# Patient Record
Sex: Male | Born: 1970 | Race: White | Hispanic: No | Smoking: Current every day smoker
Health system: Southern US, Community
[De-identification: ages and names within clinical notes are randomized; demographics above are authoritative.]

---

## 2010-04-08 ENCOUNTER — Inpatient Hospital Stay (HOSPITAL_COMMUNITY): Admission: EM | Admit: 2010-04-08 | Discharge: 2010-04-16 | Payer: Self-pay | Admitting: Pulmonary Disease

## 2010-04-08 ENCOUNTER — Ambulatory Visit: Payer: Self-pay | Admitting: Internal Medicine

## 2010-04-08 ENCOUNTER — Encounter: Payer: Self-pay | Admitting: Pulmonary Disease

## 2010-04-08 ENCOUNTER — Ambulatory Visit: Payer: Self-pay | Admitting: Pulmonary Disease

## 2011-01-19 ENCOUNTER — Encounter: Payer: Self-pay | Admitting: Pulmonary Disease

## 2011-03-18 LAB — COMPREHENSIVE METABOLIC PANEL
AST: 105 U/L — ABNORMAL HIGH (ref 0–37)
AST: 162 U/L — ABNORMAL HIGH (ref 0–37)
Albumin: 2.7 g/dL — ABNORMAL LOW (ref 3.5–5.2)
Albumin: 2.8 g/dL — ABNORMAL LOW (ref 3.5–5.2)
BUN: 35 mg/dL — ABNORMAL HIGH (ref 6–23)
Calcium: 8.2 mg/dL — ABNORMAL LOW (ref 8.4–10.5)
Calcium: 8.3 mg/dL — ABNORMAL LOW (ref 8.4–10.5)
Chloride: 105 mEq/L (ref 96–112)
GFR calc Af Amer: 46 mL/min — ABNORMAL LOW (ref >60)
GFR calc Af Amer: 48 mL/min — ABNORMAL LOW (ref >60)
GFR calc non Af Amer: 38 mL/min — ABNORMAL LOW (ref >60)
GFR calc non Af Amer: 39 mL/min — ABNORMAL LOW (ref >60)
Potassium: 3.6 mEq/L (ref 3.5–5.1)
Sodium: 137 mEq/L (ref 135–145)
Total Bilirubin: 1 mg/dL (ref 0.3–1.2)
Total Protein: 5.1 g/dL — ABNORMAL LOW (ref 6.0–8.3)
Total Protein: 5.4 g/dL — ABNORMAL LOW (ref 6.0–8.3)

## 2011-03-18 LAB — BASIC METABOLIC PANEL
BUN: 19 mg/dL (ref 6–23)
CO2: 26 mEq/L (ref 19–32)
Calcium: 8.7 mg/dL (ref 8.4–10.5)
Chloride: 104 mEq/L (ref 96–112)
Creatinine, Ser: 1.89 mg/dL — ABNORMAL HIGH (ref 0.4–1.5)
GFR calc Af Amer: 48 mL/min — ABNORMAL LOW (ref >60)
GFR calc non Af Amer: 42 mL/min — ABNORMAL LOW (ref >60)
Glucose, Bld: 91 mg/dL (ref 70–99)
Potassium: 3.6 mEq/L (ref 3.5–5.1)
Potassium: 3.9 mEq/L (ref 3.5–5.1)
Sodium: 136 mEq/L (ref 135–145)
Sodium: 142 mEq/L (ref 135–145)

## 2011-03-18 LAB — GLUCOSE, CAPILLARY
Glucose-Capillary: 104 mg/dL — ABNORMAL HIGH (ref 70–99)
Glucose-Capillary: 114 mg/dL — ABNORMAL HIGH (ref 70–99)
Glucose-Capillary: 118 mg/dL — ABNORMAL HIGH (ref 70–99)
Glucose-Capillary: 136 mg/dL — ABNORMAL HIGH (ref 70–99)
Glucose-Capillary: 139 mg/dL — ABNORMAL HIGH (ref 70–99)
Glucose-Capillary: 80 mg/dL (ref 70–99)
Glucose-Capillary: 86 mg/dL (ref 70–99)
Glucose-Capillary: 87 mg/dL (ref 70–99)
Glucose-Capillary: 93 mg/dL (ref 70–99)
Glucose-Capillary: 94 mg/dL (ref 70–99)
Glucose-Capillary: 96 mg/dL (ref 70–99)

## 2011-03-18 LAB — CBC
HCT: 34.5 % — ABNORMAL LOW (ref 39.0–52.0)
HCT: 35.9 % — ABNORMAL LOW (ref 39.0–52.0)
HCT: 37.5 % — ABNORMAL LOW (ref 39.0–52.0)
Hemoglobin: 12.9 g/dL — ABNORMAL LOW (ref 13.0–17.0)
MCHC: 35.4 g/dL (ref 30.0–36.0)
MCV: 91.5 fL (ref 78.0–100.0)
MCV: 91.8 fL (ref 78.0–100.0)
Platelets: 153 10*3/uL (ref 150–400)
RDW: 12.9 % (ref 11.5–15.5)
WBC: 8.1 10*3/uL (ref 4.0–10.5)
WBC: 8.4 10*3/uL (ref 4.0–10.5)

## 2011-03-18 LAB — CK: Total CK: 1734 U/L — ABNORMAL HIGH (ref 7–232)

## 2011-03-19 LAB — BASIC METABOLIC PANEL
BUN: 37 mg/dL — ABNORMAL HIGH (ref 6–23)
Chloride: 103 mEq/L (ref 96–112)
Chloride: 104 mEq/L (ref 96–112)
Creatinine, Ser: 2.36 mg/dL — ABNORMAL HIGH (ref 0.4–1.5)
GFR calc Af Amer: 26 mL/min — ABNORMAL LOW (ref >60)
Glucose, Bld: 84 mg/dL (ref 70–99)
Potassium: 3.8 mEq/L (ref 3.5–5.1)
Potassium: 4.3 mEq/L (ref 3.5–5.1)
Sodium: 138 mEq/L (ref 135–145)

## 2011-03-19 LAB — COMPREHENSIVE METABOLIC PANEL
ALT: 1074 U/L — ABNORMAL HIGH (ref 0–53)
AST: 3221 U/L — ABNORMAL HIGH (ref 0–37)
AST: 644 U/L — ABNORMAL HIGH (ref 0–37)
Albumin: 2.9 g/dL — ABNORMAL LOW (ref 3.5–5.2)
Alkaline Phosphatase: 50 U/L (ref 39–117)
Alkaline Phosphatase: 50 U/L (ref 39–117)
BUN: 36 mg/dL — ABNORMAL HIGH (ref 6–23)
CO2: 23 mEq/L (ref 19–32)
CO2: 27 mEq/L (ref 19–32)
Calcium: 7.3 mg/dL — ABNORMAL LOW (ref 8.4–10.5)
Calcium: 7.4 mg/dL — ABNORMAL LOW (ref 8.4–10.5)
Chloride: 104 mEq/L (ref 96–112)
Creatinine, Ser: 3.14 mg/dL — ABNORMAL HIGH (ref 0.4–1.5)
Creatinine, Ser: 3.26 mg/dL — ABNORMAL HIGH (ref 0.4–1.5)
GFR calc Af Amer: 26 mL/min — ABNORMAL LOW (ref >60)
GFR calc Af Amer: 27 mL/min — ABNORMAL LOW (ref >60)
GFR calc non Af Amer: 21 mL/min — ABNORMAL LOW (ref >60)
GFR calc non Af Amer: 22 mL/min — ABNORMAL LOW (ref >60)
Glucose, Bld: 121 mg/dL — ABNORMAL HIGH (ref 70–99)
Potassium: 4.8 mEq/L (ref 3.5–5.1)
Sodium: 136 mEq/L (ref 135–145)
Total Bilirubin: 0.8 mg/dL (ref 0.3–1.2)
Total Protein: 5.1 g/dL — ABNORMAL LOW (ref 6.0–8.3)
Total Protein: 5.2 g/dL — ABNORMAL LOW (ref 6.0–8.3)

## 2011-03-19 LAB — CK TOTAL AND CKMB (NOT AT ARMC)
CK, MB: 113.1 ng/mL (ref 0.3–4.0)
CK, MB: 18.8 ng/mL (ref 0.3–4.0)
Relative Index: 0.2 (ref 0.0–2.5)
Relative Index: 0.4 (ref 0.0–2.5)
Total CK: 11209 U/L — ABNORMAL HIGH (ref 7–232)
Total CK: 25491 U/L — ABNORMAL HIGH (ref 7–232)
Total CK: 42744 U/L — ABNORMAL HIGH (ref 7–232)

## 2011-03-19 LAB — STREP PNEUMONIAE URINARY ANTIGEN: Strep Pneumo Urinary Antigen: NEGATIVE

## 2011-03-19 LAB — CBC
HCT: 35.1 % — ABNORMAL LOW (ref 39.0–52.0)
HCT: 42.2 % (ref 39.0–52.0)
Hemoglobin: 13.7 g/dL (ref 13.0–17.0)
MCV: 91.1 fL (ref 78.0–100.0)
MCV: 91.9 fL (ref 78.0–100.0)
MCV: 92.2 fL (ref 78.0–100.0)
Platelets: 122 10*3/uL — ABNORMAL LOW (ref 150–400)
Platelets: DECREASED 10*3/uL (ref 150–400)
RBC: 3.94 MIL/uL — ABNORMAL LOW (ref 4.22–5.81)
RBC: 4.28 MIL/uL (ref 4.22–5.81)
RBC: 4.57 MIL/uL (ref 4.22–5.81)
RDW: 12.8 % (ref 11.5–15.5)
RDW: 13.3 % (ref 11.5–15.5)
WBC: 13 10*3/uL — ABNORMAL HIGH (ref 4.0–10.5)
WBC: 13.8 10*3/uL — ABNORMAL HIGH (ref 4.0–10.5)

## 2011-03-19 LAB — POCT I-STAT 3, ART BLOOD GAS (G3+)
Patient temperature: 97.8
Patient temperature: 98
TCO2: 25 mmol/L (ref 0–100)
TCO2: 26 mmol/L (ref 0–100)
pCO2 arterial: 41.2 mmHg (ref 35.0–45.0)
pCO2 arterial: 48.9 mmHg — ABNORMAL HIGH (ref 35.0–45.0)
pCO2 arterial: 52.6 mmHg — ABNORMAL HIGH (ref 35.0–45.0)
pH, Arterial: 7.302 — ABNORMAL LOW (ref 7.350–7.450)
pH, Arterial: 7.303 — ABNORMAL LOW (ref 7.350–7.450)
pH, Arterial: 7.373 (ref 7.350–7.450)
pO2, Arterial: 83 mmHg (ref 80.0–100.0)

## 2011-03-19 LAB — URINALYSIS, ROUTINE W REFLEX MICROSCOPIC
Glucose, UA: NEGATIVE mg/dL
Protein, ur: 100 mg/dL — AB
Specific Gravity, Urine: 1.016 (ref 1.005–1.030)
Urobilinogen, UA: 1 mg/dL (ref 0.0–1.0)

## 2011-03-19 LAB — GLUCOSE, CAPILLARY
Glucose-Capillary: 100 mg/dL — ABNORMAL HIGH (ref 70–99)
Glucose-Capillary: 100 mg/dL — ABNORMAL HIGH (ref 70–99)
Glucose-Capillary: 102 mg/dL — ABNORMAL HIGH (ref 70–99)
Glucose-Capillary: 106 mg/dL — ABNORMAL HIGH (ref 70–99)
Glucose-Capillary: 121 mg/dL — ABNORMAL HIGH (ref 70–99)
Glucose-Capillary: 124 mg/dL — ABNORMAL HIGH (ref 70–99)
Glucose-Capillary: 129 mg/dL — ABNORMAL HIGH (ref 70–99)
Glucose-Capillary: 78 mg/dL (ref 70–99)
Glucose-Capillary: 86 mg/dL (ref 70–99)
Glucose-Capillary: 93 mg/dL (ref 70–99)
Glucose-Capillary: 96 mg/dL (ref 70–99)
Glucose-Capillary: 99 mg/dL (ref 70–99)

## 2011-03-19 LAB — DIFFERENTIAL
Basophils Absolute: 0.2 10*3/uL — ABNORMAL HIGH (ref 0.0–0.1)
Basophils Relative: 1 % (ref 0–1)
Eosinophils Absolute: 0 10*3/uL (ref 0.0–0.7)
Eosinophils Relative: 0 % (ref 0–5)
Lymphocytes Relative: 14 % (ref 12–46)

## 2011-03-19 LAB — CK: Total CK: 5443 U/L — ABNORMAL HIGH (ref 7–232)

## 2011-03-19 LAB — PROTIME-INR
INR: 1.22 (ref 0.00–1.49)
Prothrombin Time: 15.3 seconds — ABNORMAL HIGH (ref 11.6–15.2)
Prothrombin Time: 17.8 seconds — ABNORMAL HIGH (ref 11.6–15.2)

## 2011-03-19 LAB — TROPONIN I: Troponin I: 5.43 ng/mL (ref 0.00–0.06)

## 2011-03-19 LAB — HEPATIC FUNCTION PANEL
Bilirubin, Direct: 0.2 mg/dL (ref 0.0–0.3)
Indirect Bilirubin: 0.9 mg/dL (ref 0.3–0.9)
Total Bilirubin: 1.1 mg/dL (ref 0.3–1.2)

## 2011-03-19 LAB — CARDIAC PANEL(CRET KIN+CKTOT+MB+TROPI)
CK, MB: 154.6 ng/mL (ref 0.3–4.0)
Relative Index: 0.5 (ref 0.0–2.5)
Troponin I: 5.86 ng/mL (ref 0.00–0.06)

## 2011-03-19 LAB — CREATININE, URINE, RANDOM: Creatinine, Urine: 173.2 mg/dL

## 2011-03-19 LAB — LACTIC ACID, PLASMA: Lactic Acid, Venous: 1.6 mmol/L (ref 0.5–2.2)

## 2011-03-19 LAB — URINE MICROSCOPIC-ADD ON

## 2011-03-19 LAB — LEGIONELLA ANTIGEN, URINE

## 2011-03-19 LAB — MRSA PCR SCREENING

## 2011-03-19 LAB — VANCOMYCIN, TROUGH: Vancomycin Tr: <5 ug/mL — ABNORMAL LOW (ref 10.0–20.0)

## 2011-03-19 LAB — URINE CULTURE
Colony Count: NO GROWTH
Culture: NO GROWTH

## 2011-03-19 LAB — APTT: aPTT: 29 seconds (ref 24–37)

## 2011-03-19 LAB — CULTURE, BLOOD (ROUTINE X 2): Culture: NO GROWTH

## 2011-03-19 LAB — TYPE AND SCREEN: ABO/RH(D): O POS

## 2011-03-19 LAB — PHOSPHORUS
Phosphorus: 5.6 mg/dL — ABNORMAL HIGH (ref 2.3–4.6)
Phosphorus: 6 mg/dL — ABNORMAL HIGH (ref 2.3–4.6)

## 2011-03-19 LAB — MAGNESIUM: Magnesium: 2.5 mg/dL (ref 1.5–2.5)

## 2011-03-19 LAB — BRAIN NATRIURETIC PEPTIDE: Pro B Natriuretic peptide (BNP): 256 pg/mL — ABNORMAL HIGH (ref 0.0–100.0)

## 2011-03-19 LAB — D-DIMER, QUANTITATIVE: D-Dimer, Quant: >20 ug/mL-FEU — ABNORMAL HIGH (ref 0.00–0.48)

## 2011-11-26 IMAGING — CR DG CHEST 1V PORT
1 series · 1 of 1 positions shown · non-contrast
Comparison: 04/08/2010

CLINICAL DATA: History of respiratory failure and ventilator
therapy.

PORTABLE CHEST - 1 VIEW

[view not recorded]
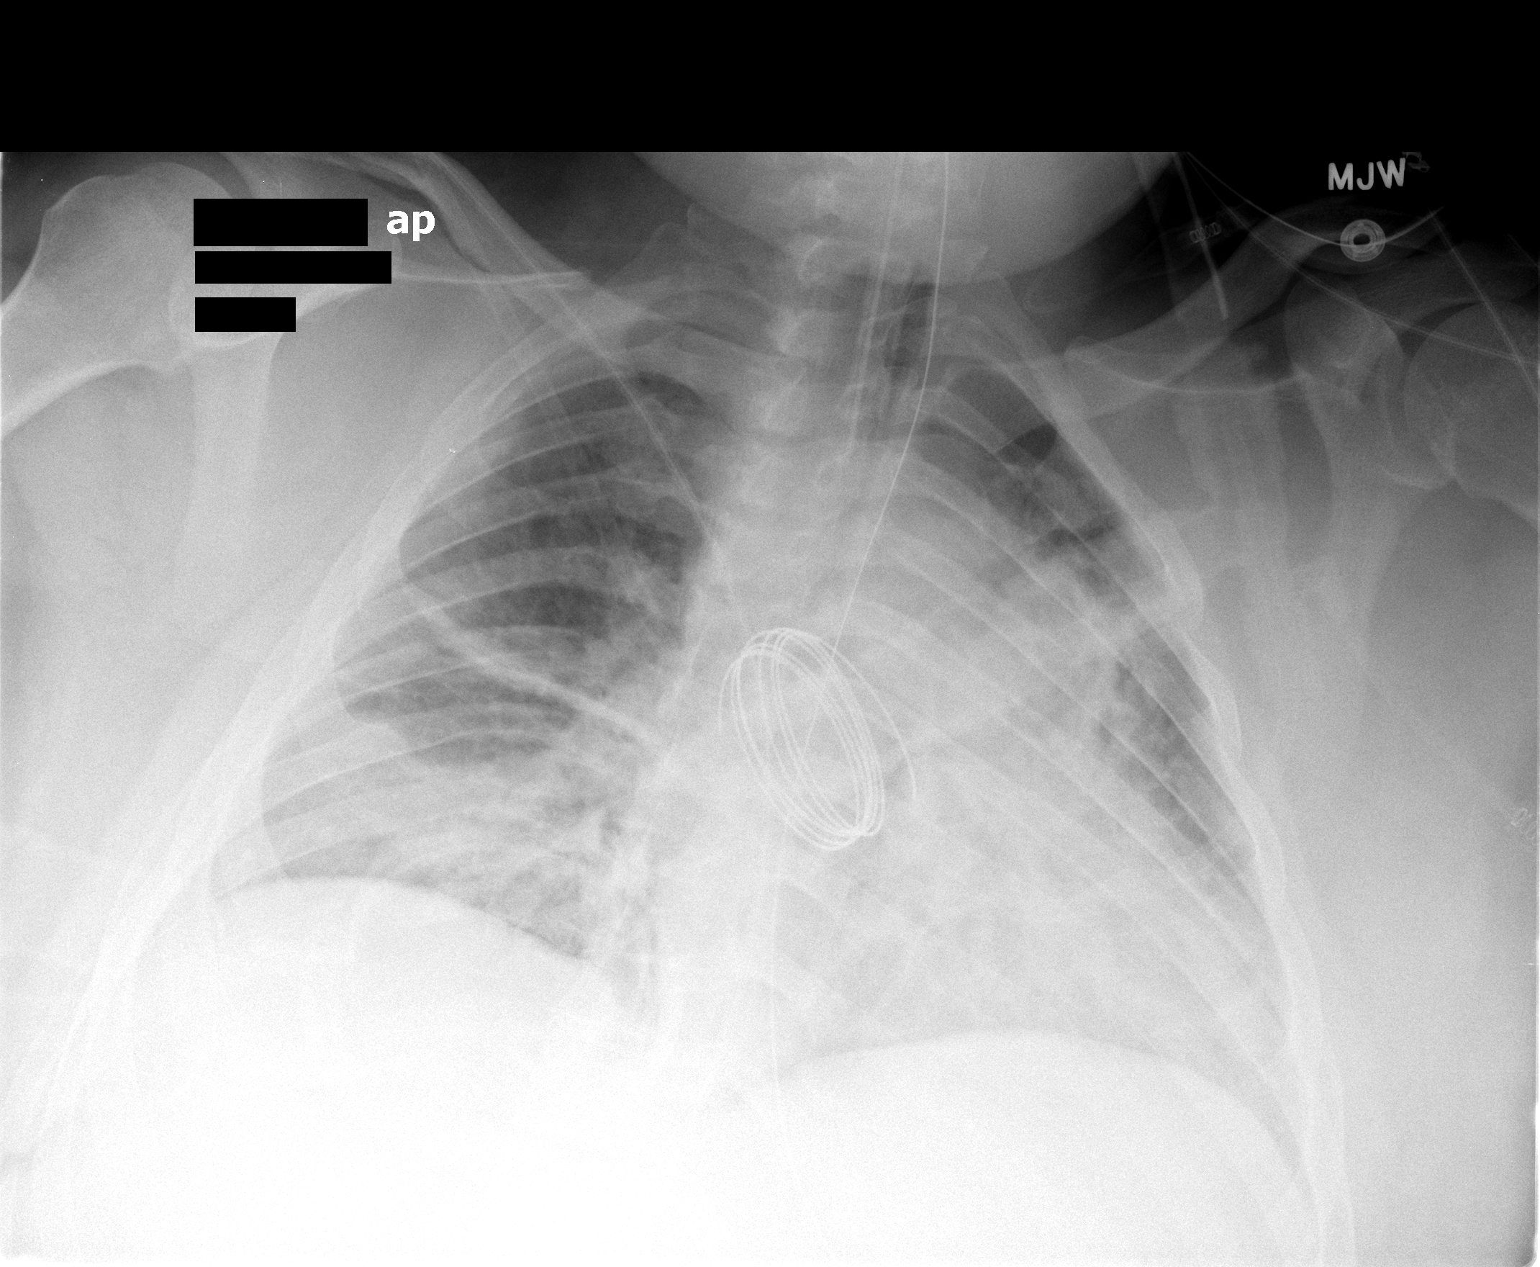

[1 of 1 positions shown; findings below may reference images not displayed]

FINDINGS: Endotracheal tube tip terminates 3 cm above carina.
Enteric tube distal portion enters stomach.  Tip is not included on
image.  There is stable enlargement of the cardiac silhouette.  The
subsegmental atelectasis or fluid in minor fissure appears slightly
smaller.  There is increasing atelectasis and infiltrative density
in the right lung base.  There is also patchy infiltrative density
and atelectasis seen in the left mid and lower lung zones.  There
are overall low lung volumes.  No pleural effusion is demonstrated.
IMPRESSION: Endotracheal tube and enteric tube are in place. Stable enlargement
of the cardiac silhouette is present.  Subsegmental atelectasis or
fluid in minor fissure appears smaller.  Increasing atelectasis and
infiltrative density is present in the right lung base.  Patchy
infiltrative density and atelectasis is seen on the left in the mid
and lower lung zones.

## 2011-11-29 IMAGING — CR DG CHEST 2V
2 series · 2 of 2 positions shown · non-contrast
Comparison: 04/10/2010

CLINICAL DATA: Follow-up right lower lobe infiltrate.
Rhabdomyolysis.  Previous myocardial infarct.

CHEST - 2 VIEW

[w chest pa]
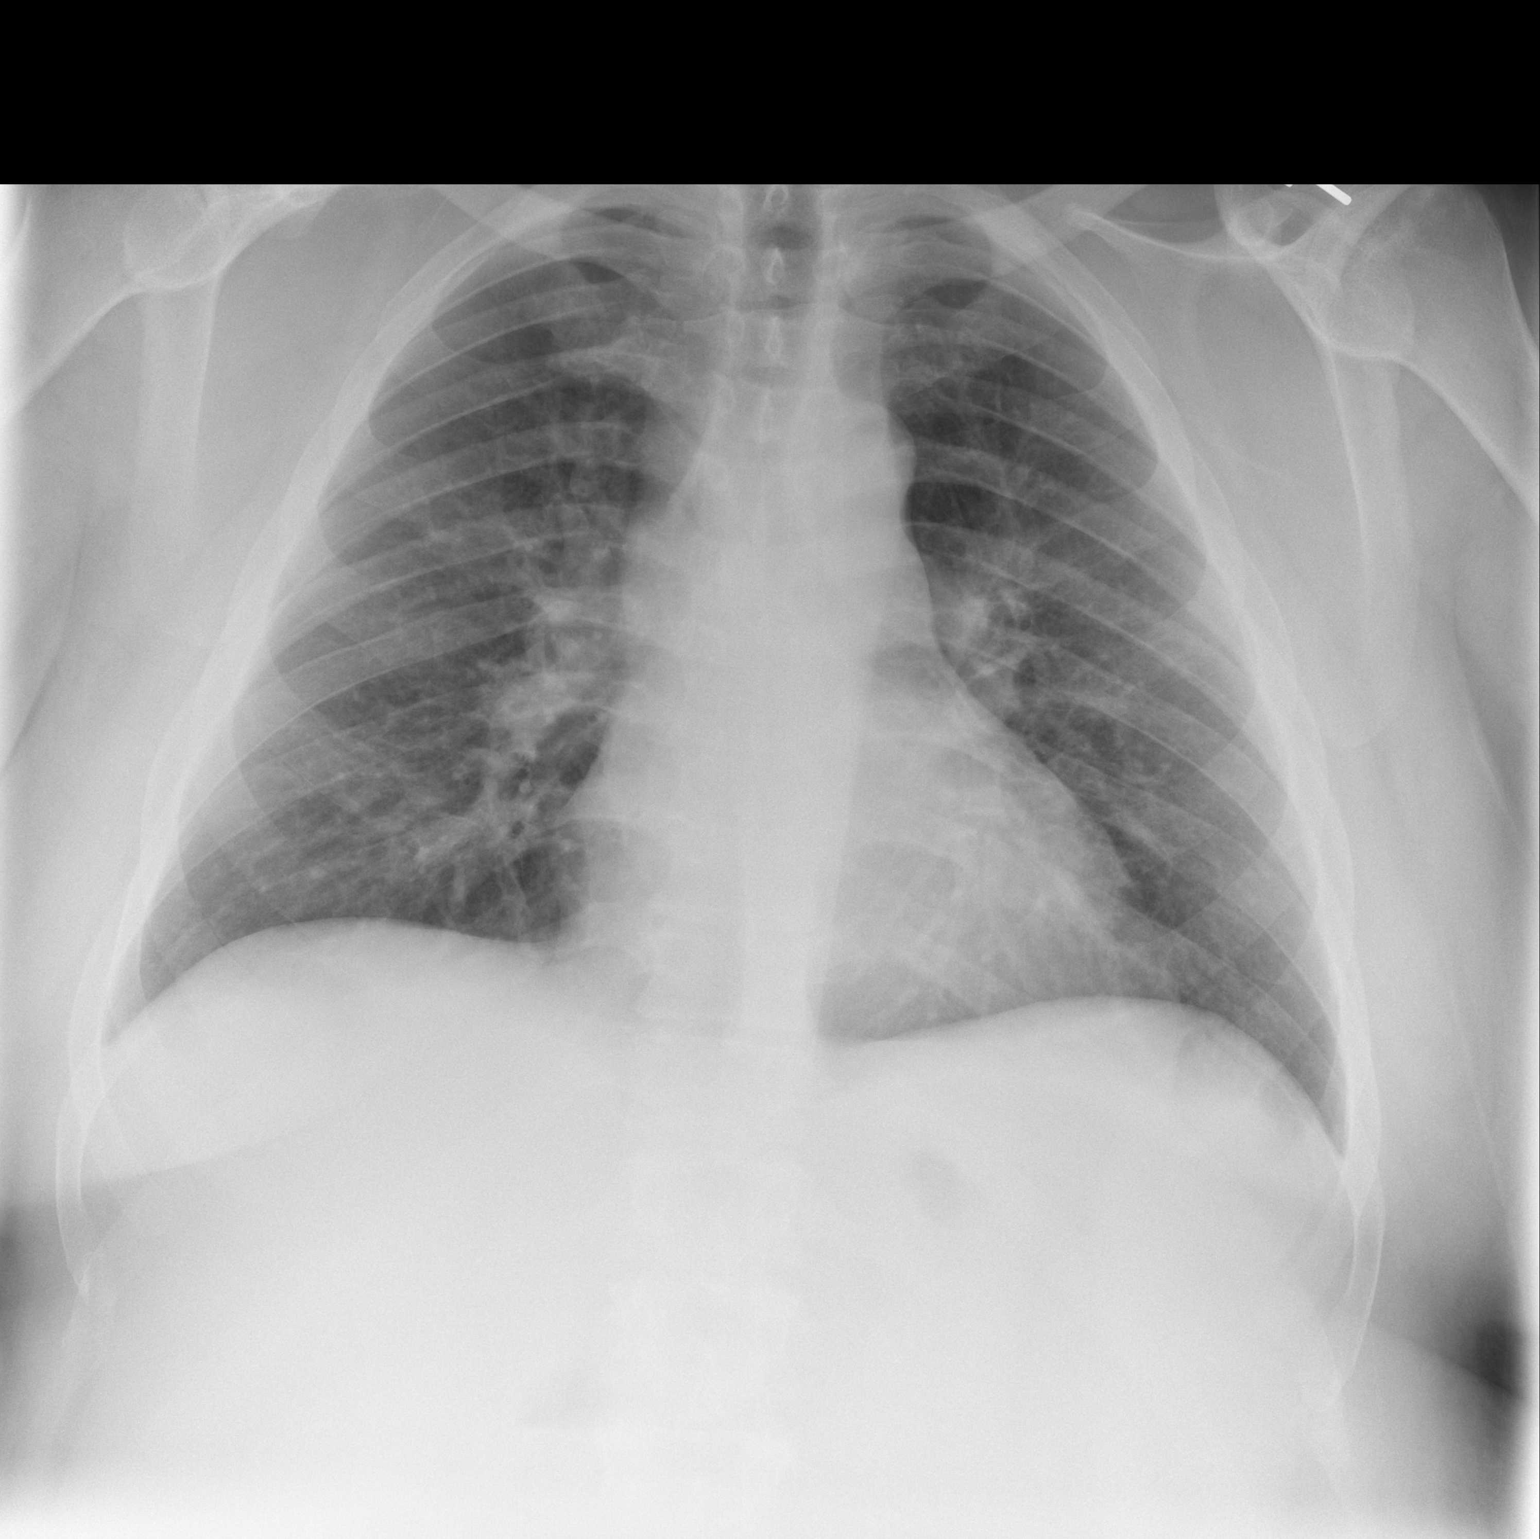

[w chest lat]
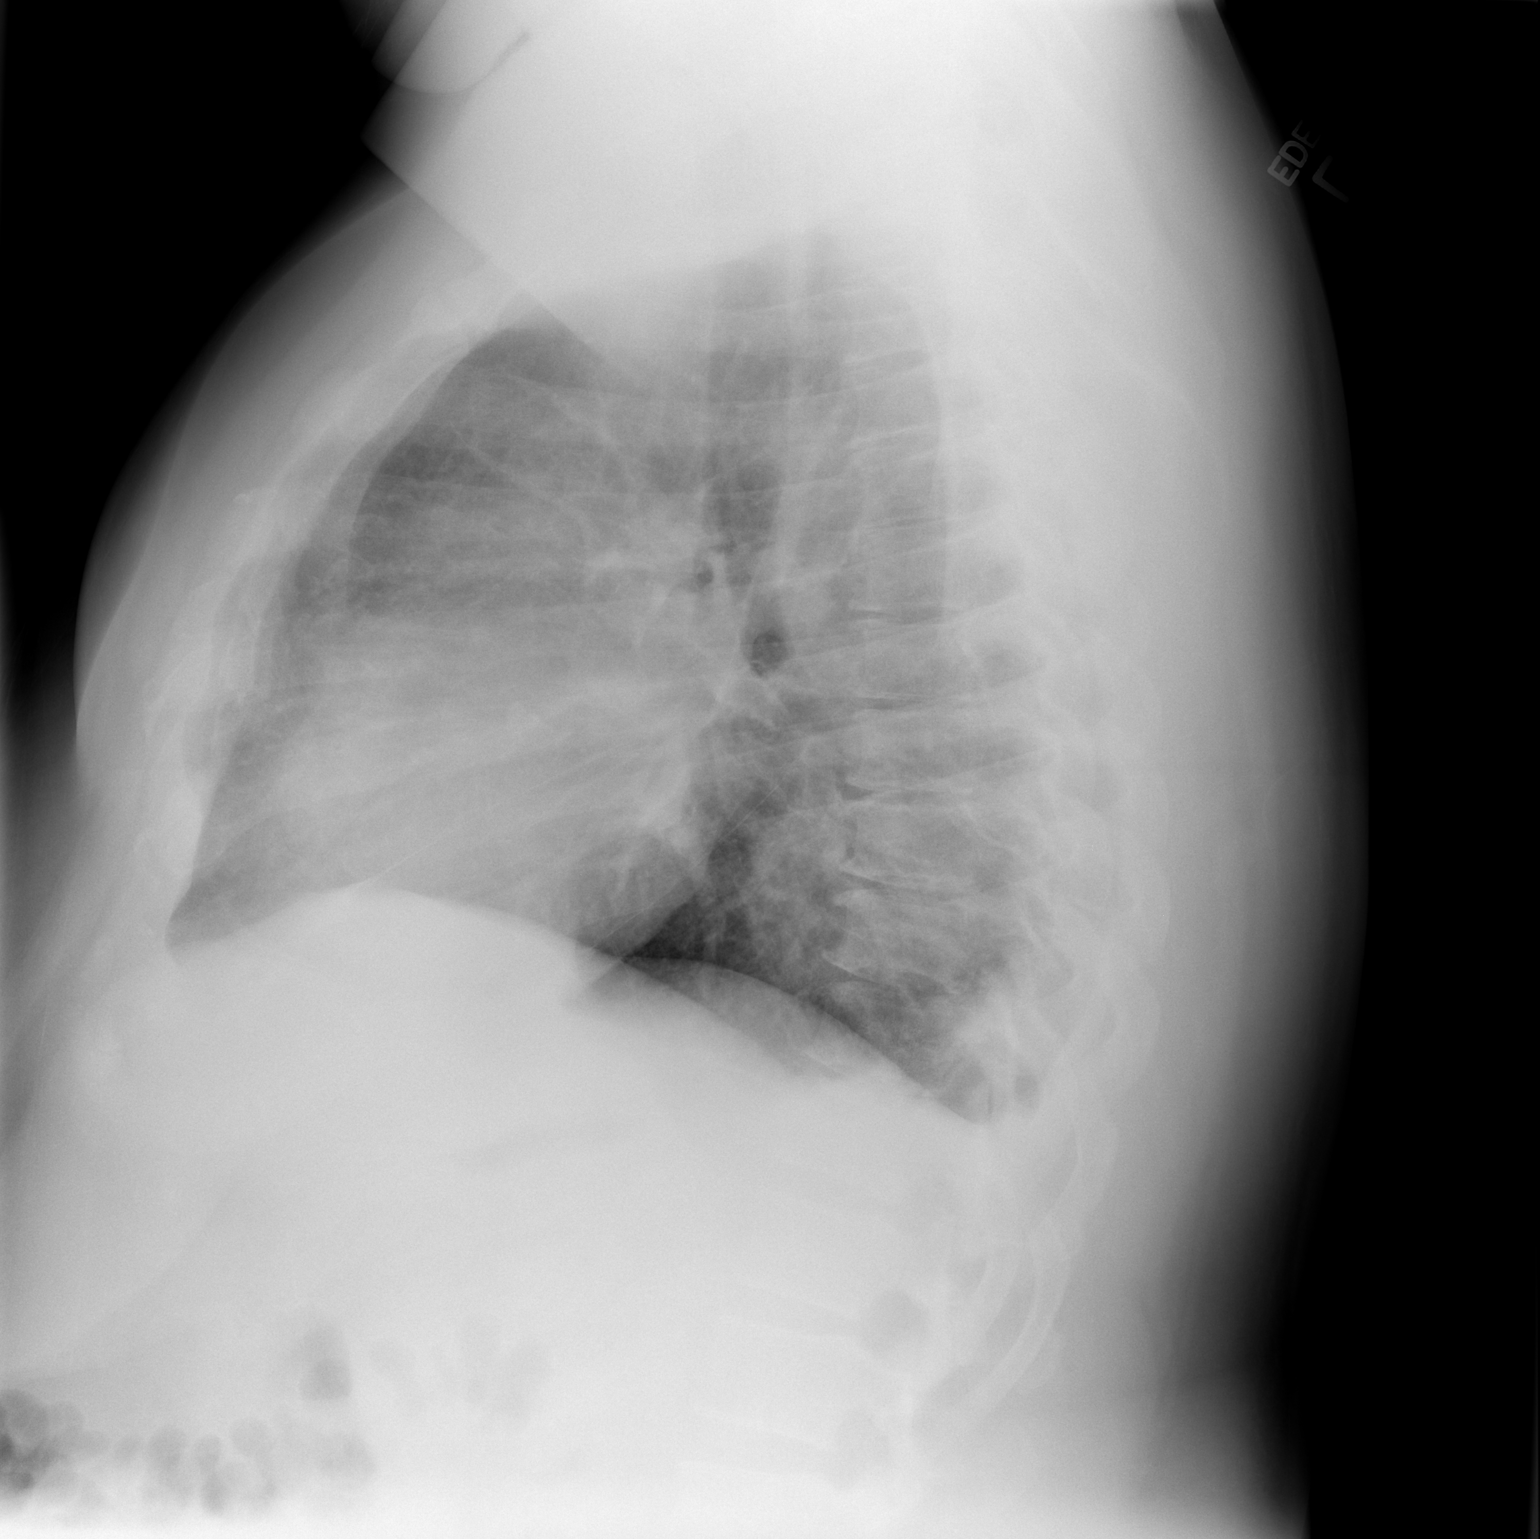

[2 of 2 positions shown; findings below may reference images not displayed]

FINDINGS: Previously seen airspace opacity in the right lung base
has resolved.  Chronic central peribronchial thickening and
interstitial prominence noted.  Lungs otherwise clear.  Tiny
posterior left pleural effusion versus pleural thickening noted.
Heart size and mediastinal contours are within normal limits
IMPRESSION: Resolution of right lower lung airspace opacity since prior study.
Question tiny left posterior pleural effusion versus pleural
thickening.

## 2017-09-02 ENCOUNTER — Encounter (HOSPITAL_COMMUNITY): Payer: Self-pay

## 2017-09-02 ENCOUNTER — Inpatient Hospital Stay (HOSPITAL_COMMUNITY)
Admission: AD | Admit: 2017-09-02 | Discharge: 2017-09-07 | DRG: 885 | Disposition: A | Discharge status: Home / Self Care | Payer: Federal, State, Local not specified - Other | Source: Intra-hospital | Attending: Psychiatry | Admitting: Psychiatry

## 2017-09-02 DIAGNOSIS — Z794 Long term (current) use of insulin: Secondary | ICD-10-CM

## 2017-09-02 DIAGNOSIS — I10 Essential (primary) hypertension: Secondary | ICD-10-CM | POA: Diagnosis present

## 2017-09-02 DIAGNOSIS — E119 Type 2 diabetes mellitus without complications: Secondary | ICD-10-CM | POA: Diagnosis present

## 2017-09-02 DIAGNOSIS — Z881 Allergy status to other antibiotic agents status: Secondary | ICD-10-CM

## 2017-09-02 DIAGNOSIS — F1124 Opioid dependence with opioid-induced mood disorder: Secondary | ICD-10-CM

## 2017-09-02 DIAGNOSIS — Z882 Allergy status to sulfonamides status: Secondary | ICD-10-CM | POA: Diagnosis not present

## 2017-09-02 DIAGNOSIS — F1123 Opioid dependence with withdrawal: Secondary | ICD-10-CM | POA: Diagnosis present

## 2017-09-02 DIAGNOSIS — G4710 Hypersomnia, unspecified: Secondary | ICD-10-CM | POA: Diagnosis present

## 2017-09-02 DIAGNOSIS — F333 Major depressive disorder, recurrent, severe with psychotic symptoms: Secondary | ICD-10-CM | POA: Diagnosis not present

## 2017-09-02 DIAGNOSIS — F332 Major depressive disorder, recurrent severe without psychotic features: Secondary | ICD-10-CM | POA: Diagnosis present

## 2017-09-02 DIAGNOSIS — Z590 Homelessness: Secondary | ICD-10-CM | POA: Diagnosis not present

## 2017-09-02 DIAGNOSIS — F419 Anxiety disorder, unspecified: Secondary | ICD-10-CM | POA: Diagnosis not present

## 2017-09-02 DIAGNOSIS — F329 Major depressive disorder, single episode, unspecified: Secondary | ICD-10-CM | POA: Diagnosis not present

## 2017-09-02 DIAGNOSIS — Z79899 Other long term (current) drug therapy: Secondary | ICD-10-CM

## 2017-09-02 DIAGNOSIS — F39 Unspecified mood [affective] disorder: Secondary | ICD-10-CM | POA: Diagnosis not present

## 2017-09-02 DIAGNOSIS — Z888 Allergy status to other drugs, medicaments and biological substances status: Secondary | ICD-10-CM | POA: Diagnosis not present

## 2017-09-02 DIAGNOSIS — Z818 Family history of other mental and behavioral disorders: Secondary | ICD-10-CM | POA: Diagnosis not present

## 2017-09-02 DIAGNOSIS — F17210 Nicotine dependence, cigarettes, uncomplicated: Secondary | ICD-10-CM | POA: Diagnosis present

## 2017-09-02 LAB — GLUCOSE, CAPILLARY: GLUCOSE-CAPILLARY: 139 mg/dL — AB (ref 65–99)

## 2017-09-02 MED ORDER — ONDANSETRON 4 MG PO TBDP
4.0000 mg | ORAL_TABLET | Freq: Four times a day (QID) | ORAL | Status: DC | PRN
  Administered 2017-09-03: 4 mg via ORAL
  Filled 2017-09-02: qty 1

## 2017-09-02 MED ORDER — TRAZODONE HCL 100 MG PO TABS
200.0000 mg | ORAL_TABLET | Freq: Every day | ORAL | Status: DC
  Administered 2017-09-02: 200 mg via ORAL
  Filled 2017-09-02 (×3): qty 2

## 2017-09-02 MED ORDER — HYDROXYZINE HCL 25 MG PO TABS: 25.0000 mg | ORAL_TABLET | Freq: Four times a day (QID) | ORAL | Status: DC | PRN

## 2017-09-02 MED ORDER — CLONIDINE HCL 0.1 MG PO TABS
0.1000 mg | ORAL_TABLET | Freq: Every day | ORAL | Status: DC
  Filled 2017-09-02: qty 1

## 2017-09-02 MED ORDER — HYDROXYZINE HCL 25 MG PO TABS
25.0000 mg | ORAL_TABLET | Freq: Four times a day (QID) | ORAL | Status: DC | PRN
  Administered 2017-09-02: 25 mg via ORAL
  Filled 2017-09-02: qty 1
  Filled 2017-09-02 (×3): qty 10
  Filled 2017-09-02: qty 1

## 2017-09-02 MED ORDER — PANTOPRAZOLE SODIUM 40 MG PO TBEC
40.0000 mg | DELAYED_RELEASE_TABLET | Freq: Every day | ORAL | Status: DC
  Administered 2017-09-03 – 2017-09-07 (×5): 40 mg via ORAL
  Filled 2017-09-02 (×6): qty 1

## 2017-09-02 MED ORDER — ALUM & MAG HYDROXIDE-SIMETH 200-200-20 MG/5ML PO SUSP: 30.0000 mL | ORAL | Status: DC | PRN

## 2017-09-02 MED ORDER — INSULIN ASPART 100 UNIT/ML ~~LOC~~ SOLN
0.0000 [IU] | Freq: Three times a day (TID) | SUBCUTANEOUS | Status: DC
  Administered 2017-09-03: 3 [IU] via SUBCUTANEOUS
  Administered 2017-09-03: 2 [IU] via SUBCUTANEOUS
  Administered 2017-09-04: 5 [IU] via SUBCUTANEOUS
  Administered 2017-09-04: 2 [IU] via SUBCUTANEOUS
  Administered 2017-09-05 (×2): 3 [IU] via SUBCUTANEOUS
  Administered 2017-09-06: 8 [IU] via SUBCUTANEOUS
  Administered 2017-09-06 – 2017-09-07 (×2): 2 [IU] via SUBCUTANEOUS

## 2017-09-02 MED ORDER — LOPERAMIDE HCL 2 MG PO CAPS
2.0000 mg | ORAL_CAPSULE | ORAL | Status: DC | PRN
  Administered 2017-09-03: 4 mg via ORAL
  Filled 2017-09-02: qty 2

## 2017-09-02 MED ORDER — DICYCLOMINE HCL 20 MG PO TABS
20.0000 mg | ORAL_TABLET | Freq: Four times a day (QID) | ORAL | Status: DC | PRN
  Administered 2017-09-03: 20 mg via ORAL
  Filled 2017-09-02: qty 1

## 2017-09-02 MED ORDER — METFORMIN HCL 500 MG PO TABS
1000.0000 mg | ORAL_TABLET | Freq: Two times a day (BID) | ORAL | Status: DC
  Administered 2017-09-03 – 2017-09-07 (×9): 1000 mg via ORAL
  Filled 2017-09-02 (×12): qty 2

## 2017-09-02 MED ORDER — CLOTRIMAZOLE 1 % EX CREA
TOPICAL_CREAM | Freq: Two times a day (BID) | CUTANEOUS | Status: DC
  Administered 2017-09-02 – 2017-09-04 (×2): via TOPICAL
  Administered 2017-09-04: 1 via TOPICAL
  Administered 2017-09-05: 08:00:00 via TOPICAL
  Filled 2017-09-02 (×2): qty 15

## 2017-09-02 MED ORDER — CLONIDINE HCL 0.1 MG PO TABS
0.1000 mg | ORAL_TABLET | Freq: Four times a day (QID) | ORAL | Status: AC
  Administered 2017-09-02 – 2017-09-05 (×10): 0.1 mg via ORAL
  Filled 2017-09-02 (×11): qty 1

## 2017-09-02 MED ORDER — METHOCARBAMOL 500 MG PO TABS
500.0000 mg | ORAL_TABLET | Freq: Three times a day (TID) | ORAL | Status: DC | PRN
  Administered 2017-09-02 – 2017-09-07 (×10): 500 mg via ORAL
  Filled 2017-09-02 (×10): qty 1

## 2017-09-02 MED ORDER — MAGNESIUM HYDROXIDE 400 MG/5ML PO SUSP: 30.0000 mL | Freq: Every day | ORAL | Status: DC | PRN

## 2017-09-02 MED ORDER — CLONIDINE HCL 0.1 MG PO TABS
0.1000 mg | ORAL_TABLET | ORAL | Status: AC
  Administered 2017-09-05 – 2017-09-07 (×4): 0.1 mg via ORAL
  Filled 2017-09-02 (×4): qty 1

## 2017-09-02 NOTE — Progress Notes (Addendum)
Patient ID: Austin Ryan, male   DOB: 04/15/1971, 46 y.o.   MRN: [id number]  Pt currently presents with a flat affect and labile behavior. Pt reports to writer that their goal is to "get my medications that I missed today." These medications include Metformin, Protonix, Atenolol and Trazodone. Pt states "I have gained so much weight over the past 10 days, I was just staying in bed all day." Pt intermittently seen in the dayroom, no interaction with peers. Pt reports good sleep with PTA medication, Trazodone 200mg . Pt complains of on going pain in his lower back 6/10 that lessens with activity.   Pt provided with medications per providers orders. Pt's labs and vitals were monitored throughout the night. Pt given a 1:1 about emotional and mental status. Pt supported and encouraged to express concerns and questions. Pt educated on medications and alternative pain medications. Provider notified of patients complaints, see MAR.   Pt's safety ensured with 15 minute and environmental checks. Pt currently denies SI/HI and A/V hallucinations. States "not right now" when referring to his SI. Pt verbally agrees to seek staff if SI/HI or A/VH occurs and to consult with staff before acting on any harmful thoughts. Pt wishes to speak to MD in the AM about restarting his Celexa. Will continue POC.

## 2017-09-02 NOTE — Tx Team (Signed)
Initial Treatment Plan 09/02/2017 9:00 PM Austin PickGarland Ryan ZOX:[id number]    PATIENT STRESSORS: Marital or family conflict Substance abuse   PATIENT STRENGTHS: Ability for insight Communication skills Motivation for treatment/growth Supportive family/friends   PATIENT IDENTIFIED PROBLEMS: Substance Abuse  At risk for suicide  "Want to go out and get a job"  "Want to be around for the grandkids"  "I have to get structure"             DISCHARGE CRITERIA:  Ability to meet basic life and health needs Adequate post-discharge living arrangements Improved stabilization in mood, thinking, and/or behavior Motivation to continue treatment in a less acute level of care Need for constant or close observation no longer present Withdrawal symptoms are absent or subacute and managed without 24-hour nursing intervention  PRELIMINARY DISCHARGE PLAN: Attend 12-step recovery group Outpatient therapy Placement in alternative living arrangements  PATIENT/FAMILY INVOLVEMENT: This treatment plan has been presented to and reviewed with the patient, Austin Ryan.  The patient and family have been given the opportunity to ask questions and make suggestions.  Shantae Vantol, RN 09/02/2017, 9:00 PM
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
## 2017-09-03 DIAGNOSIS — M791 Myalgia: Secondary | ICD-10-CM

## 2017-09-03 DIAGNOSIS — Z818 Family history of other mental and behavioral disorders: Secondary | ICD-10-CM

## 2017-09-03 DIAGNOSIS — R197 Diarrhea, unspecified: Secondary | ICD-10-CM

## 2017-09-03 DIAGNOSIS — F329 Major depressive disorder, single episode, unspecified: Secondary | ICD-10-CM

## 2017-09-03 DIAGNOSIS — F1124 Opioid dependence with opioid-induced mood disorder: Secondary | ICD-10-CM

## 2017-09-03 DIAGNOSIS — R109 Unspecified abdominal pain: Secondary | ICD-10-CM

## 2017-09-03 DIAGNOSIS — Z811 Family history of alcohol abuse and dependence: Secondary | ICD-10-CM

## 2017-09-03 DIAGNOSIS — F17210 Nicotine dependence, cigarettes, uncomplicated: Secondary | ICD-10-CM

## 2017-09-03 DIAGNOSIS — F1910 Other psychoactive substance abuse, uncomplicated: Secondary | ICD-10-CM

## 2017-09-03 LAB — GLUCOSE, CAPILLARY
GLUCOSE-CAPILLARY: 126 mg/dL — AB (ref 65–99)
GLUCOSE-CAPILLARY: 91 mg/dL (ref 65–99)
GLUCOSE-CAPILLARY: 156 mg/dL — AB (ref 65–99)
Glucose-Capillary: 111 mg/dL — ABNORMAL HIGH (ref 65–99)

## 2017-09-03 MED ORDER — LORAZEPAM 0.5 MG PO TABS
0.5000 mg | ORAL_TABLET | Freq: Four times a day (QID) | ORAL | Status: DC | PRN
  Administered 2017-09-03 – 2017-09-07 (×14): 0.5 mg via ORAL
  Filled 2017-09-03 (×14): qty 1

## 2017-09-03 MED ORDER — NICOTINE 21 MG/24HR TD PT24
21.0000 mg | MEDICATED_PATCH | Freq: Every day | TRANSDERMAL | Status: DC
  Administered 2017-09-03 – 2017-09-07 (×5): 21 mg via TRANSDERMAL
  Filled 2017-09-03 (×5): qty 1

## 2017-09-03 MED ORDER — TRAZODONE HCL 100 MG PO TABS
100.0000 mg | ORAL_TABLET | Freq: Every evening | ORAL | Status: DC | PRN
  Administered 2017-09-03 – 2017-09-06 (×4): 100 mg via ORAL
  Filled 2017-09-03 (×2): qty 1
  Filled 2017-09-03: qty 7
  Filled 2017-09-03: qty 1

## 2017-09-03 MED ORDER — CITALOPRAM HYDROBROMIDE 20 MG PO TABS
20.0000 mg | ORAL_TABLET | Freq: Every day | ORAL | Status: DC
Start: 2017-09-03 — End: 2017-09-07
  Administered 2017-09-03 – 2017-09-07 (×5): 20 mg via ORAL
  Filled 2017-09-03 (×7): qty 1

## 2017-09-03 NOTE — Progress Notes (Signed)
[facility] Group Notes:  (Nursing/MHT/Case Management/Adjunct)  Date:  09/02/2017 Time:  2045  Type of Therapy:  wrap up group  Participation Level:  Active  Participation Quality:  Appropriate, Attentive and Supportive  Affect:  Depressed and Irritable  Cognitive:  Appropriate  Insight:  Improving  Engagement in Group:  Engaged  Modes of Intervention:  Clarification, Education and Support  Summary of Progress/Problems: Pt shared that he was able to eat today because of an increased appetite. Pt explained he wasn't feeling emotionally well enough to eat the past few days. If patient could change any one thing it would be that his 711 yr old grandson wouldn't be in foster care. Pt shares that he needs structure and wants to stay sober. Pt feels like getting a job would better his life. Pt previously was taking care of his mother and another elderly friend.   Johann CapersMcNeil, Austin Ryan 09/03/2017, 4:31 AM

## 2017-09-03 NOTE — Progress Notes (Signed)
Patient ID: Austin Ryan, male   DOB: 11/25/1971, 46 y.o.   MRN: [id number]  MD Cobos was notified of EKG and given the EKG printout results. Printout placed back on chart after MD Cobos had seen results.

## 2017-09-03 NOTE — Progress Notes (Signed)
Patient did not attend group.

## 2017-09-03 NOTE — Progress Notes (Signed)
Patient ID: Austin Ryan [place], male   DOB: 06/16/1971, 46 y.o.   MRN: [id number]  DAR: Pt. Denies SI/HI and A/V Hallucinations. He reports sleep is fair, appetite is good, energy level is low, and concentration is poor. He rates depression 8/10, hopelessness 7/10, and anxiety 10/10. PRN Ativan administered to patient and patient reports relief. Patient does report lower back pain and received PRN Robaxin. Patient states that this does help ease his pain. Support and encouragement provided to the patient. Scheduled medications administered to patient per physician's orders. Patient is depressed in mood and affect however he does smile at times when speaking with Clinical research associatewriter. He is seen in the milieu and does interact intermittently with his peers. He does continue to report rash in his groin area however has refused scheduled cream from this Clinical research associatewriter. No behavioral issues noted in this patient. Q15 minute checks are maintained for safety.

## 2017-09-03 NOTE — Progress Notes (Signed)
Patient ID: Austin Ryan, male   DOB: 03/04/1971, [age] y.o.   MRN: [id number]  Pt currently presents with a flat affect and anxious behavior. Reports ambivalence to Clinical research associatewriter about [facility]. Endorses on going anxiety that has not lessened even though "I got the medicine I wanted." Pt has complaints about the food at dinner tonight. Pt reports good sleep with current medication regimen.   Pt provided with medications per providers orders. Pt's labs and vitals were monitored throughout the night. Pt given a 1:1 about emotional and mental status. Pt supported and encouraged to express concerns and questions. Pt educated on medications.  Pt's safety ensured with 15 minute and environmental checks. Pt currently denies SI/HI and A/V hallucinations. Pt verbally agrees to seek staff if SI/HI or A/VH occurs and to consult with staff before acting on any harmful thoughts. Will continue POC.

## 2017-09-03 NOTE — Progress Notes (Signed)
Patient ID: Austin Ryan, male   DOB: 04/27/1971, 46 y.o.   MRN: [id number]  EKG performed and placed on patient's chart.
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
## 2017-09-03 NOTE — Progress Notes (Signed)
Nutrition Brief Note  Patient identified on the Malnutrition Screening Tool (MST) Report  Wt Readings from Last 15 Encounters:  09/02/17 259 lb (117.5 kg)    Body mass index is 44.46 kg/m. Patient meets criteria for morbid obesity based on current BMI. No other weight hx available in the chart. Skin WDL. Pt admitted for SI with a plan to overdose. He reports that for the 9 days PTA he stayed in bed and did not eat anything during that time.   Current diet order is Carb Modified and pt is eating as desired for meals and snacks. Labs and medications reviewed. No nutrition interventions warranted at this time. If nutrition issues arise, please consult RD.     Austin GammonJessica Mitul Hallowell, MS, RD, LDN, [facility] Inpatient Clinical Dietitian Pager # [phone] After hours/weekend pager # [phone]

## 2017-09-03 NOTE — [facility] Suicide Risk Assessment (Signed)
[facility]   Nursing information obtained from:  Patient Demographic factors:  Male, Caucasian Current Mental Status:  Suicidal ideation indicated by patient, Self-harm thoughts, Self-harm behaviors Loss Factors:  NA (homeless:pt states he had to move becuase of his pill taking) Historical Factors:  Prior suicide attempts Risk Reduction Factors:  Sense of responsibility to family (pt states he wants to be around for his grandkids)  Total Time spent with patient: 45 minutes Principal Problem:  Opiate Induced Mood Disorder, Opiate Dependence , Opiate Withdrawal  Diagnosis:   Patient Active Problem List   Diagnosis Date Noted  . MDD (major depressive disorder) [F32.9] 09/02/2017    Continued Clinical Symptoms:  Alcohol Use Disorder Identification Test Final Score (AUDIT): 0 The "Alcohol Use Disorders Identification Test", Guidelines for Use in Primary Care, Second Edition.  World Science writerHealth Organization [facility](WHO). Score between 0-7:  no or low risk or alcohol related problems. Score between 8-15:  moderate risk of alcohol related problems. Score between 16-19:  high risk of alcohol related problems. Score 20 or above:  warrants further diagnostic evaluation for alcohol dependence and treatment.   CLINICAL FACTORS:  46 year old male, reports history of opiate dependence, states he has been abusing opiate analgesics daily, reports worsening depression, and recent exacerbation of psychosocial stressors, following his parents , with whom he lives, asking him to leave due to his drug abuse   Psychiatric Specialty Exam: Physical Exam  ROS  Blood pressure 133/78, pulse 70, temperature 98.6 F (37 C), resp. rate 20, height 5\' 4"  (1.626 m), weight 117.5 kg (259 lb), SpO2 100 %.Body mass index is 44.46 kg/m.  See admit note MSE   COGNITIVE FEATURES THAT CONTRIBUTE TO RISK:  Closed-mindedness and Loss of executive function    SUICIDE RISK:   Moderate:  Frequent suicidal  ideation with limited intensity, and duration, some specificity in terms of plans, no associated intent, good self-control, limited dysphoria/symptomatology, some risk factors present, and identifiable protective factors, including available and accessible social support.  PLAN OF CARE: Patient will be admitted to inpatient psychiatric unit for stabilization and safety. Will provide and encourage milieu participation. Provide medication management and maked adjustments as needed. Will also provide medication management to minimize WDL symptoms. Will follow daily.    I certify that inpatient services furnished can reasonably be expected to improve the patient's condition.   Craige CottaFernando A Jadelyn Elks, MD 09/03/2017, 11:06 AM

## 2017-09-03 NOTE — Progress Notes (Signed)
Patient ID: Austin Ryan, male   DOB: 04/28/1971, 46 y.o.   MRN: [id number]  Pt refused his EKG. Requests medications and to speak with a provider.

## 2017-09-03 NOTE — H&P (Signed)
Psychiatric Admission Assessment Adult  Patient Identification: Austin Ryan MRN:  [id number] Date of Evaluation:  09/03/2017 Chief Complaint: " I got into oxycodone " Principal Diagnosis: Opiate Dependence, Opiate Induced Mood Disorder  Diagnosis:   Patient Active Problem List   Diagnosis Date Noted  . MDD (major depressive disorder) [F32.9] 09/02/2017   History of Present Illness:Patient is a 46 year old divorced male, currently unemployed, lives with parents. Reports history of opiate dependence. States he uses Oxycodone daily, in variable quantities depending on availability. Use has progressed recently, and states that a few days ago for the first time ever used IV drug x 1 time. ( Normally uses PO)  States that he recently was asked by his parents to move out because of his drug abuse, which exacerbated his depression. Reports worsening depression, and describes recent suicidal ideations with thoughts of overdosing on drugs . Denies psychotic symptoms, endorses some neuro-vegetative symptoms of depression as below. At this time reports symptoms of opiate WDL- cramps, aches, loose stools, anxiety.  Associated Signs/Symptoms: Depression Symptoms:  depressed mood, anhedonia, hypersomnia, suicidal thoughts with specific plan, loss of energy/fatigue, decreased appetite, (Hypo) Manic Symptoms:  Does not endorse  Anxiety Symptoms: Reports vague anxiety and worry, and a prior history of frequent panic attacks, now improved Psychotic Symptoms:  denies  PTSD Symptoms: Does not endorse  Total Time spent with patient: 45 minutes  Past Psychiatric History: Patient reports history of depression, reports history of suicidal attempt in 2011. Denies history of mania, denies history of psychosis, describes history of panic disorder but states that this has tended to improve overtime . States he has been prescribed  Celexa 40 mgrs QDAY for years   Is the patient at risk to self? Yes.    Has the  patient been a risk to self in the past 6 months? Yes.    Has the patient been a risk to self within the distant past? Yes.    Is the patient a risk to others? No.  Has the patient been a risk to others in the past 6 months? No.  Has the patient been a risk to others within the distant past? No.   Prior Inpatient Therapy: Prior Inpatient Therapy: Yes Prior Therapy Dates:  (pt reports last tx 6 years ago) Prior Outpatient Therapy: Prior Outpatient Therapy:  (unknown) Does patient have an ACCT team?: No Does patient have Intensive In-House Services?  : No Does patient have [facility] services? : No Does patient have P4CC services?: No  Alcohol Screening: 1. How often do you have a drink containing alcohol?: Never 9. Have you or someone else been injured as a result of your drinking?: No 10. Has a relative or friend or a doctor or another health worker been concerned about your drinking or suggested you cut down?: No Alcohol Use Disorder Identification Test Final Score (AUDIT): 0 Brief Intervention: AUDIT score less than 7 or less-screening does not suggest unhealthy drinking-brief intervention not indicated Substance Abuse History in the last 12 months: denies alcohol or other drug abuse, reports opiate abuse. 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, but states he has not been abusing BZDs recently  Consequences of Substance Abuse: States that his parents have been unhappy about his opiate abuse and recently asked him to leave their house  Previous Psychotropic Medications: Reports he takes Celexa 40 mgrs QDAY  Psychological Evaluations: No  Past Medical History: Reports history of DM, history of HTN,  history of ketoacidosis, history of surgery on groin  to remove gangrenous tissue Family History:parents alive, father has history of alcohol dependence- now sober, and has attempted suicide in the past while intoxicated,patient's adult daughter has history of Bipolar Disorder  Family Psychiatric   History: as above  Tobacco Screening: Have you used any form of tobacco in the last 30 days? (Cigarettes, Smokeless Tobacco, Cigars, and/or Pipes): Yes Tobacco use, Select all that apply: 5 or more cigarettes per day Are you interested in Tobacco Cessation Medications?: No, patient refused Counseled patient on smoking cessation including recognizing danger situations, developing coping skills and basic information about quitting provided: Refused/Declined practical counseling Social History: divorced x 18 years , has three adult children, lives with parents, but states they recently asked him to leave due to his drug use, currently unemployed, no legal issues   History  Alcohol use Not on file     History  Drug use: Unknown    Additional Social History: Marital status: Divorced (unknown)    Pain Medications: see Mar Prescriptions: see Mar Over the Counter: see Mar History of alcohol / drug use?: Yes Name of Substance 1: Opiates  1 - Age of First Use: n/a 1 - Amount (size/oz): unknown amount 'pt reports he does not know how much oxycodone he has used'  1 - Frequency: n/a 1 - Duration: n/a 1 - Last Use / Amount: n/a  Allergies:   Allergies  Allergen Reactions  . Erythromycin Other (See Comments)    Reaction unknown - too long ago per pt  . Bactrim [Sulfamethoxazole-Trimethoprim] Other (See Comments)    Red Man Syndrome, high fever  . Sulfa Antibiotics Rash  . Toradol [Ketorolac Tromethamine] Other (See Comments)    "Makes me like the Walking Dead"   Lab Results:  Results for orders placed or performed during the hospital encounter of 09/02/17 (from the past 48 hour(s))  Glucose, capillary     Status: Abnormal   Collection Time: 09/02/17  9:15 PM  Result Value Ref Range   Glucose-Capillary 139 (H) 65 - 99 mg/dL   Comment 1 Notify RN    Comment 2 Document in Chart   Glucose, capillary     Status: Abnormal   Collection Time: 09/03/17  5:47 AM  Result Value Ref Range    Glucose-Capillary 126 (H) 65 - 99 mg/dL   Comment 1 Notify RN    Comment 2 Document in Chart     Blood Alcohol level:  Lab Results  Component Value Date   [facility]  04/08/2010    <5        LOWEST DETECTABLE LIMIT FOR SERUM ALCOHOL IS 5 mg/dL FOR MEDICAL PURPOSES ONLY    Metabolic Disorder Labs:   No results found for: PROLACTIN No results found for: CHOL, TRIG, HDL, CHOLHDL, VLDL, LDLCALC  Current Medications: Current Facility-Administered Medications  Medication Dose Route Frequency Provider Last Rate Last Dose  . alum & mag hydroxide-simeth (MAALOX/MYLANTA) 200-200-20 MG/5ML suspension 30 mL  30 mL Oral Q4H PRN Rankin, Shuvon B, NP      . cloNIDine (CATAPRES) tablet 0.1 mg  0.1 mg Oral QID Donell Sievert E, PA-C   0.1 mg at 09/03/17 0816   Followed by  . [START ON 09/05/2017] cloNIDine (CATAPRES) tablet 0.1 mg  0.1 mg Oral BH-qamhs Simon, Spencer E, PA-C       Followed by  . [START ON 09/08/2017] cloNIDine (CATAPRES) tablet 0.1 mg  0.1 mg Oral QAC breakfast Donell Sievert E, PA-C      . clotrimazole (LOTRIMIN) 1 %  cream   Topical BID Kerry Hough, PA-C      . dicyclomine (BENTYL) tablet 20 mg  20 mg Oral Q6H PRN Kerry Hough, PA-C   20 mg at 09/03/17 0820  . hydrOXYzine (ATARAX/VISTARIL) tablet 25 mg  25 mg Oral Q6H PRN Kerry Hough, PA-C   25 mg at 09/02/17 2159  . insulin aspart (novoLOG) injection 0-15 Units  0-15 Units Subcutaneous TID WC Donell Sievert E, PA-C   2 Units at 09/03/17 [phone]  . loperamide (IMODIUM) capsule 2-4 mg  2-4 mg Oral PRN Donell Sievert E, PA-C   4 mg at 09/03/17 0820  . magnesium hydroxide (MILK OF MAGNESIA) suspension 30 mL  30 mL Oral Daily PRN Rankin, Shuvon B, NP      . metFORMIN (GLUCOPHAGE) tablet 1,000 mg  1,000 mg Oral BID WC Donell Sievert E, PA-C   1,000 mg at 09/03/17 0816  . methocarbamol (ROBAXIN) tablet 500 mg  500 mg Oral Q8H PRN Kerry Hough, PA-C   500 mg at 09/03/17 0820  . nicotine (NICODERM CQ - dosed in mg/24 hours) patch  21 mg  21 mg Transdermal Daily Money, Gerlene Burdock, FNP   21 mg at 09/03/17 0816  . ondansetron (ZOFRAN-ODT) disintegrating tablet 4 mg  4 mg Oral Q6H PRN Kerry Hough, PA-C   4 mg at 09/03/17 9604  . pantoprazole (PROTONIX) EC tablet 40 mg  40 mg Oral Daily Kerry Hough, PA-C   40 mg at 09/03/17 0816  . traZODone (DESYREL) tablet 200 mg  200 mg Oral QHS Donell Sievert E, PA-C   200 mg at 09/02/17 2200   PTA Medications: Prescriptions Prior to Admission  Medication Sig Dispense Refill Last Dose  . aspirin EC 81 MG tablet Take 81 mg by mouth daily.   09/02/2017  . clotrimazole-betamethasone (LOTRISONE) cream Apply 1 application topically 2 (two) times daily as needed (For rash.).   0 09/03/2017  . HUMULIN 70/30 KWIKPEN (70-30) 100 UNIT/ML PEN Inject 15 Units into the skin 2 (two) times daily.  3 09/03/2017  . metFORMIN (GLUCOPHAGE) 1000 MG tablet Take 1,000 mg by mouth 2 (two) times daily with a meal.  3 09/03/2017  . traMADol (ULTRAM) 50 MG tablet Take 50 mg by mouth every 6 (six) hours as needed (For pain.).   0 09/02/2017  . traZODone (DESYREL) 100 MG tablet Take 200 mg by mouth at bedtime.  5 09/02/2017    Musculoskeletal: Strength & Muscle Tone: within normal limits Gait & Station: normal Patient leans: N/A  Psychiatric Specialty Exam: Physical Exam  Review of Systems  Constitutional: Negative.   HENT: Negative.   Eyes: Negative.   Respiratory: Negative.   Cardiovascular: Negative.   Gastrointestinal: Positive for abdominal pain and diarrhea.       Abdominal cramps  Genitourinary: Negative.   Musculoskeletal: Positive for myalgias.  Skin: Negative.   Neurological: Negative for seizures.  Endo/Heme/Allergies: Negative.   Psychiatric/Behavioral: Positive for depression, substance abuse and suicidal ideas.  All other systems reviewed and are negative.   Blood pressure 133/78, pulse 70, temperature 98.6 F (37 C), resp. rate 20, height  (1.626 m), weight 117.5 kg (259 lb), SpO2  100 %.Body mass index is 44.46 kg/m.  General Appearance: Fairly Groomed  Eye Contact:  Fair  Speech:  Normal Rate  Volume:  Normal  Mood:  depressed , anxious  Affect:  constricted, anxious  Thought Process:  Linear and Descriptions of Associations: Intact  Orientation:  Full (Time, Place, and Person)  Thought Content:  no hallucinations, no delusions, not internally preoccupied, somatically focused   Suicidal Thoughts:  No currently denies active suicidal ideations and contracts for safety on unit , denies homicidal ideations   Homicidal Thoughts:  No  Memory:  recent and remote grossly intact   Judgement:  Fair  Insight:  Fair  Psychomotor Activity:  Normal does not appear to be in any acute distress , no tremors   Concentration:  Concentration: Good and Attention Span: Good  Recall:  Good  Fund of Knowledge:  Good  Language:  Good  Akathisia:  Negative  Handed:  Right  AIMS (if indicated):     Assets:  Communication Skills Desire for Improvement Resilience  ADL's:  Intact  Cognition:  WNL  Sleep:  Number of Hours: 6.25    Treatment Plan Summary: Daily contact with patient to assess and evaluate symptoms and progress in treatment, Medication management, Plan inpatient admission  and medications as below  Observation Level/Precautions:  15 minute checks  Laboratory:  as needed   Psychotherapy:  Milieu, groups   Medications:  Currently on Clonidine detox protocol to minimize risk of opiate WDL. Ativan 0.5 mgrs Q 6 hours PRN for significant anxiety as needed .   Continue Celexa at 20 mgrs QDAY for now On Glucophage and Insulin sliding scale for management of DM  Consultations:  As needed   Discharge Concerns:  -  Estimated LOS:  5 days   Other:     Physician Treatment Plan for Primary Diagnosis: Opiate Use Disorder  Long Term Goal(s): Improvement in symptoms so as ready for discharge  Short Term Goals: Ability to identify triggers associated with substance  abuse/mental health issues will improve  Physician Treatment Plan for Secondary Diagnosis: Opiate Induced Mood Disorder   Long Term Goal(s): Improvement in symptoms so as ready for discharge  Short Term Goals: Ability to identify changes in lifestyle to reduce recurrence of condition will improve, Ability to identify and develop effective coping behaviors will improve, Ability to maintain clinical measurements within normal limits will improve and Ability to identify triggers associated with substance abuse/mental health issues will improve  I certify that inpatient services furnished can reasonably be expected to improve the patient's condition.    Craige Cotta, MD 9/6/201810:44 AM

## 2017-09-03 NOTE — [facility] Group Notes (Signed)
LCSW Group Therapy Note  09/03/2017 1:15pm  Type of Therapy/Topic:  Group Therapy:  Emotion Regulation  Participation Level:  Active   Description of Group:   The purpose of this group is to assist patients in learning to regulate negative emotions and experience positive emotions. Patients will be guided to discuss ways in which they have been vulnerable to their negative emotions. These vulnerabilities will be juxtaposed with experiences of positive emotions or situations, and patients will be challenged to use positive emotions to combat negative ones. Special emphasis will be placed on coping with negative emotions in conflict situations, and patients will process healthy conflict resolution skills.  Therapeutic Goals: 1. Patient will identify two positive emotions or experiences to reflect on in order to balance out negative emotions 2. Patient will label two or more emotions that they find the most difficult to experience 3. Patient will demonstrate positive conflict resolution skills through discussion and/or role plays  Summary of Patient Progress: Pt was able to identify physical warning signs related to anxiety such as shaking and feeling "knots" in his stomach. Pt was attentive throughout.    Therapeutic Modalities:   Cognitive Behavioral Therapy Feelings Identification Dialectical Behavioral Therapy   Verdene LennertLauren C Zane Samson, LCSW 09/03/2017 4:08 PM

## 2017-09-04 DIAGNOSIS — F39 Unspecified mood [affective] disorder: Secondary | ICD-10-CM

## 2017-09-04 DIAGNOSIS — F332 Major depressive disorder, recurrent severe without psychotic features: Principal | ICD-10-CM

## 2017-09-04 DIAGNOSIS — F419 Anxiety disorder, unspecified: Secondary | ICD-10-CM

## 2017-09-04 LAB — LIPID PANEL
Cholesterol: 156 mg/dL (ref 0–200)
HDL: 32 mg/dL — ABNORMAL LOW (ref >40)
LDL Cholesterol: 65 mg/dL (ref 0–99)
Total CHOL/HDL Ratio: 4.9 ratio
Triglycerides: 297 mg/dL — ABNORMAL HIGH (ref <150)
VLDL: 59 mg/dL — ABNORMAL HIGH (ref 0–40)

## 2017-09-04 LAB — GLUCOSE, CAPILLARY
GLUCOSE-CAPILLARY: 125 mg/dL — AB (ref 65–99)
Glucose-Capillary: 86 mg/dL (ref 65–99)
Glucose-Capillary: 235 mg/dL — ABNORMAL HIGH (ref 65–99)
Glucose-Capillary: 99 mg/dL (ref 65–99)

## 2017-09-04 LAB — TSH: TSH: 0.651 u[IU]/mL (ref 0.350–4.500)

## 2017-09-04 MED ORDER — ACETAMINOPHEN 325 MG PO TABS: 650.0000 mg | ORAL_TABLET | Freq: Four times a day (QID) | ORAL | Status: DC | PRN

## 2017-09-04 NOTE — Progress Notes (Signed)
Patient has been up in the dayroom watching tv with minimal interaction with peers. He attended group this evening and participated. Patient currently denies si/hi/a/v hall. He is looking forward to discharge soon. Patient informed of scheduled medication and prns available. Support and encouragement offered, safety maintained on unit, will continue to monitor.

## 2017-09-04 NOTE — Tx Team (Signed)
Interdisciplinary Treatment and Diagnostic Plan Update  09/04/2017 Time of Session: 0830AM Austin PickGarland Ryan MRN: [id number]  Principal Diagnosis: MDD  Secondary Diagnoses: Active Problems:   MDD (major depressive disorder)   Current Medications:  Current Facility-Administered Medications  Medication Dose Route Frequency Provider Last Rate Last Dose  . alum & mag hydroxide-simeth (MAALOX/MYLANTA) 200-200-20 MG/5ML suspension 30 mL  30 mL Oral Q4H PRN Rankin, Shuvon B, NP      . citalopram (CELEXA) tablet 20 mg  20 mg Oral Daily Cobos, Fernando A, MD   20 mg at 09/03/17 1200  . cloNIDine (CATAPRES) tablet 0.1 mg  0.1 mg Oral QID Kerry HoughSimon, Spencer E, PA-C   0.1 mg at 09/03/17 2113   Followed by  . [START ON 09/05/2017] cloNIDine (CATAPRES) tablet 0.1 mg  0.1 mg Oral BH-qamhs Simon, Spencer E, PA-C       Followed by  . [START ON 09/08/2017] cloNIDine (CATAPRES) tablet 0.1 mg  0.1 mg Oral QAC breakfast Donell SievertSimon, Spencer E, PA-C      . clotrimazole (LOTRIMIN) 1 % cream   Topical BID Kerry HoughSimon, Spencer E, PA-C      . dicyclomine (BENTYL) tablet 20 mg  20 mg Oral Q6H PRN Donell SievertSimon, Spencer E, PA-C   20 mg at 09/03/17 0820  . hydrOXYzine (ATARAX/VISTARIL) tablet 25 mg  25 mg Oral Q6H PRN Kerry HoughSimon, Spencer E, PA-C   25 mg at 09/02/17 2159  . insulin aspart (novoLOG) injection 0-15 Units  0-15 Units Subcutaneous TID WC Donell SievertSimon, Spencer E, PA-C   2 Units at 09/04/17 [phone]  . loperamide (IMODIUM) capsule 2-4 mg  2-4 mg Oral PRN Donell SievertSimon, Spencer E, PA-C   4 mg at 09/03/17 0820  . LORazepam (ATIVAN) tablet 0.5 mg  0.5 mg Oral Q6H PRN Cobos, Rockey SituFernando A, MD   0.5 mg at 09/04/17 0517  . magnesium hydroxide (MILK OF MAGNESIA) suspension 30 mL  30 mL Oral Daily PRN Rankin, Shuvon B, NP      . metFORMIN (GLUCOPHAGE) tablet 1,000 mg  1,000 mg Oral BID WC Simon, Spencer E, PA-C   1,000 mg at 09/03/17 1721  . methocarbamol (ROBAXIN) tablet 500 mg  500 mg Oral Q8H PRN Kerry HoughSimon, Spencer E, PA-C   500 mg at 09/03/17 2113  . nicotine (NICODERM  CQ - dosed in mg/24 hours) patch 21 mg  21 mg Transdermal Daily Money, Gerlene Burdockravis B, FNP   21 mg at 09/03/17 0816  . ondansetron (ZOFRAN-ODT) disintegrating tablet 4 mg  4 mg Oral Q6H PRN Kerry HoughSimon, Spencer E, PA-C   4 mg at 09/03/17 [id number]  . pantoprazole (PROTONIX) EC tablet 40 mg  40 mg Oral Daily Kerry HoughSimon, Spencer E, PA-C   40 mg at 09/03/17 0816  . traZODone (DESYREL) tablet 100 mg  100 mg Oral QHS PRN Cobos, Rockey SituFernando A, MD   100 mg at 09/03/17 2113   PTA Medications: Prescriptions Prior to Admission  Medication Sig Dispense Refill Last Dose  . aspirin EC 81 MG tablet Take 81 mg by mouth daily.   09/02/2017  . clotrimazole-betamethasone (LOTRISONE) cream Apply 1 application topically 2 (two) times daily as needed (For rash.).   0 09/03/2017  . HUMULIN 70/30 KWIKPEN (70-30) 100 UNIT/ML PEN Inject 15 Units into the skin 2 (two) times daily.  3 09/03/2017  . metFORMIN (GLUCOPHAGE) 1000 MG tablet Take 1,000 mg by mouth 2 (two) times daily with a meal.  3 09/03/2017  . traMADol (ULTRAM) 50 MG tablet Take 50 mg by mouth every 6 (  six) hours as needed (For pain.).   0 09/02/2017  . traZODone (DESYREL) 100 MG tablet Take 200 mg by mouth at bedtime.  5 09/02/2017    Patient Stressors: Marital or family conflict Substance abuse  Patient Strengths: Ability for insight Barrister's clerk for treatment/growth Supportive family/friends  Treatment Modalities: Medication Management, Group therapy, Case management,  1 to 1 session with clinician, Psychoeducation, Recreational therapy.   Physician Treatment Plan for Primary Diagnosis:  MDD Long Term Goal(s): Improvement in symptoms so as ready for discharge Improvement in symptoms so as ready for discharge   Short Term Goals: Ability to identify triggers associated with substance abuse/mental health issues will improve Ability to identify changes in lifestyle to reduce recurrence of condition will improve Ability to identify and develop effective coping  behaviors will improve Ability to maintain clinical measurements within normal limits will improve Ability to identify triggers associated with substance abuse/mental health issues will improve  Medication Management: Evaluate patient's response, side effects, and tolerance of medication regimen.  Therapeutic Interventions: 1 to 1 sessions, Unit Group sessions and Medication administration.  Evaluation of Outcomes: Progressing  Physician Treatment Plan for Secondary Diagnosis: Active Problems:   MDD (major depressive disorder)  Long Term Goal(s): Improvement in symptoms so as ready for discharge Improvement in symptoms so as ready for discharge   Short Term Goals: Ability to identify triggers associated with substance abuse/mental health issues will improve Ability to identify changes in lifestyle to reduce recurrence of condition will improve Ability to identify and develop effective coping behaviors will improve Ability to maintain clinical measurements within normal limits will improve Ability to identify triggers associated with substance abuse/mental health issues will improve     Medication Management: Evaluate patient's response, side effects, and tolerance of medication regimen.  Therapeutic Interventions: 1 to 1 sessions, Unit Group sessions and Medication administration.  Evaluation of Outcomes: Progressing   RN Treatment Plan for Primary Diagnosis:  MDD Long Term Goal(s): Knowledge of disease and therapeutic regimen to maintain health will improve  Short Term Goals: Ability to verbalize frustration and anger appropriately will improve, Ability to participate in decision making will improve and Ability to verbalize feelings will improve  Medication Management: RN will administer medications as ordered by provider, will assess and evaluate patient's response and provide education to patient for prescribed medication. RN will report any adverse and/or side effects to  prescribing provider.  Therapeutic Interventions: 1 on 1 counseling sessions, Psychoeducation, Medication administration, Evaluate responses to treatment, Monitor vital signs and CBGs as ordered, Perform/monitor CIWA, COWS, AIMS and Fall Risk screenings as ordered, Perform wound care treatments as ordered.  Evaluation of Outcomes: Progressing   LCSW Treatment Plan for Primary Diagnosis:  MDD Long Term Goal(s): Safe transition to appropriate next level of care at discharge, Engage patient in therapeutic group addressing interpersonal concerns.  Short Term Goals: Engage patient in aftercare planning with referrals and resources, Facilitate patient progression through stages of change regarding substance use diagnoses and concerns and Identify triggers associated with mental health/substance abuse issues  Therapeutic Interventions: Assess for all discharge needs, 1 to 1 time with Social worker, Explore available resources and support systems, Assess for adequacy in community support network, Educate family and significant other(s) on suicide prevention, Complete Psychosocial Assessment, Interpersonal group therapy.  Evaluation of Outcomes: Progressing   Progress in Treatment: Attending groups: Yes. Participating in groups: Yes. Taking medication as prescribed: Yes. Toleration medication: Yes. Family/Significant other contact made: No, will contact:  pt's mother  Patient understands diagnosis: Yes. Discussing patient identified problems/goals with staff: Yes. Medical problems stabilized or resolved: Yes. Denies suicidal/homicidal ideation: Yes. Issues/concerns per patient self-inventory: No. Other: n/a   New problem(s) identified: No, Describe:  n/a  New Short Term/Long Term Goal(s): medication management for mood stabilization/detox, elimination of SI thoughts, development of comprehensive mental wellness/sobriety plan.   Patient Goal: "To detox off of pain pills and go back home with  my parents."  Discharge Plan or Barriers: CSW assessing- Pt is hoping that his parents will allow him to return to their home. Follow-up with [facility] MHAG pamphlet and NA information provided [place]) for additional community support.   Reason for Continuation of Hospitalization: Anxiety Depression Medication stabilization Withdrawal symptoms  Estimated Length of Stay: Monday 09/07/17  Attendees: Patient: 09/04/2017 8:26 AM  Physician: Dr. Jama Flavors MD 09/04/2017 8:26 AM  Nursing: Terrall Laity RN 09/04/2017 8:26 AM  RN Care Manager: Onnie Boer CM 09/04/2017 8:26 AM  Social Worker: Trula Slade, LCSW 09/04/2017 8:26 AM  Recreational Therapist: x 09/04/2017 8:26 AM  Other: Armandina Stammer NP; Feliz Beam Money NP 09/04/2017 8:26 AM  Other:  09/04/2017 8:26 AM  Other: 09/04/2017 8:26 AM    Scribe for Treatment Team: Ledell Peoples Smart, LCSW 09/04/2017 8:26 AM

## 2017-09-04 NOTE — Progress Notes (Signed)
Nursing Note 09/04/2017 [id number]  Data Reports sleeping good with/without PRN sleep med.  Rates depression 5/10, hopelessness 5/10, and anxiety 8/10. Affect depressed but appropriate.  Denies HI, SI, AVH.  Attending groups, withdrawn but in milieu throughout day.   Action Spoke with patient 1:1, nurse offered support to patient throughout shift.  Continues to be monitored on 15 minute checks for safety.  Response Remains safe and appropriate on unit.

## 2017-09-04 NOTE — Progress Notes (Signed)
Adult Psychoeducational Group Note  Date:  09/04/2017 Time:  4:38 PM  Group Topic/Focus:  Building Self Esteem:   The Focus of this group is helping patients become aware of the effects of self-esteem on their lives, the things they and others do that enhance or undermine their self-esteem, seeing the relationship between their level of self-esteem and the choices they make and learning ways to enhance self-esteem.  Participation Level:  Active  Participation Quality:  Appropriate  Affect:  Appropriate  Cognitive:  Alert  Insight: Good  Engagement in Group:  Improving  Modes of Intervention:  Activity  Additional Comments:  Pt did participate in all group activities and discussions today.  Shandrell Boda R Koraline Phillipson 09/04/2017, 4:38 PM

## 2017-09-04 NOTE — [facility] Suicide Risk Assessment (Signed)
[facility] INPATIENT:  Family/Significant Other Suicide Prevention Education  Suicide Prevention Education:  Education Completed; Austin PesaCarolyn Ryan (pt's mother) [phone] has been identified by the patient as the family member/significant other with whom the patient will be residing, and identified as the person(s) who will aid the patient in the event of a mental health crisis (suicidal ideations/suicide attempt).  With written consent from the patient, the family member/significant other has been provided the following suicide prevention education, prior to the and/or following the discharge of the patient.  The suicide prevention education provided includes the following:  Suicide risk factors  Suicide prevention and interventions  National Suicide Hotline telephone number  [facility] assessment telephone number  [facility] Emergency Assistance 911  [facility] and/or [facility]idential Mobile Crisis Unit telephone number  Request made of family/significant other to:  Remove weapons (e.g., guns, rifles, knives), all items previously/currently identified as safety concern.    Remove drugs/medications (over-the-counter, prescriptions, illicit drugs), all items previously/currently identified as a safety concern.  The family member/significant other verbalizes understanding of the suicide prevention education information provided.  The family member/significant other agrees to remove the items of safety concern listed above.  Pt's mother states that pt has taken the family's money for pain pills. "He needs longer term treatment." CSW asked pt's mother to continue to encourage this with pt, but unfortunately at this time, Pt is not willing to pursue inpatient treatment. Pt's mother understood that pt must consent to this, he cannot be forced into treatment. SPE and aftercare plan reviewed.   Samatha Anspach N Smart LCSW 09/04/2017, 10:00 AM

## 2017-09-04 NOTE — Progress Notes (Signed)
[facility] MD Progress Note  09/04/2017 4:52 PM Austin Ryan  MRN:  [id number]   Subjective:  Patient reports that he feels good today and denies any withdrawal symptoms. He states that he went through withdrawals for 10 days at his daughters house and he only took 1-2 pills the day he came to the hospital so it would ease his withdrawals until he got admitted. He states he can go to his mother's again and be safe until he follows up and goes to [facility] or [facility].   Objective: Patient is pleasant and cooperative. He is seen in the day room interacting with staff and other patients appropriately. Will continue Celexa and Ativan while admitted. Will consider discharging patient over the weekend so he can follow up at [facility] or [facility] next week since he is requesting 2-3 days at home.   Principal Problem: MDD (major depressive disorder), recurrent episode, severe (HCC) Diagnosis:   Patient Active Problem List   Diagnosis Date Noted  . MDD (major depressive disorder), recurrent episode, severe (HCC) [F33.2] 09/02/2017   Total Time spent with patient: 25 minutes  Past Psychiatric History: See H&P  Past Medical History: History reviewed. No pertinent past medical history. History reviewed. No pertinent surgical history. Family History: History reviewed. No pertinent family history. Family Psychiatric  History: See H&P Social History:  History  Alcohol use Not on file     History  Drug use: Unknown    Social History   Social History  . Marital status: Divorced    Spouse name: N/A  . Number of children: N/A  . Years of education: N/A   Social History Main Topics  . Smoking status: Current Every Day Smoker    Packs/day: 1.50  . Smokeless tobacco: Never Used  . Alcohol use None  . Drug use: Unknown  . Sexual activity: Not Asked   Other Topics Concern  . None   Social History Narrative  . None   Additional Social History:    Pain Medications: see Mar Prescriptions: see Mar Over the  Counter: see Mar History of alcohol / drug use?: Yes Name of Substance 1: Opiates  1 - Age of First Use: n/a 1 - Amount (size/oz): unknown amount 'pt reports he does not know how much oxycodone he has used'  1 - Frequency: n/a 1 - Duration: n/a 1 - Last Use / Amount: n/a                  Sleep: Good  Appetite:  Good  Current Medications: Current Facility-Administered Medications  Medication Dose Route Frequency Provider Last Rate Last Dose  . alum & mag hydroxide-simeth (MAALOX/MYLANTA) 200-200-20 MG/5ML suspension 30 mL  30 mL Oral Q4H PRN Rankin, Shuvon B, NP      . citalopram (CELEXA) tablet 20 mg  20 mg Oral Daily Baylynn Shifflett, Rockey Situ, MD   20 mg at 09/04/17 0907  . cloNIDine (CATAPRES) tablet 0.1 mg  0.1 mg Oral QID Donell Sievert E, PA-C   0.1 mg at 09/04/17 1213   Followed by  . [START ON 09/05/2017] cloNIDine (CATAPRES) tablet 0.1 mg  0.1 mg Oral BH-qamhs Simon, Spencer E, PA-C       Followed by  . [START ON 09/08/2017] cloNIDine (CATAPRES) tablet 0.1 mg  0.1 mg Oral QAC breakfast Donell Sievert E, PA-C      . clotrimazole (LOTRIMIN) 1 % cream   Topical BID Kerry Hough, PA-C   1 application at 09/04/17 0908  . dicyclomine (  BENTYL) tablet 20 mg  20 mg Oral Q6H PRN Kerry Hough, PA-C   20 mg at 09/03/17 0820  . hydrOXYzine (ATARAX/VISTARIL) tablet 25 mg  25 mg Oral Q6H PRN Kerry Hough, PA-C   25 mg at 09/02/17 2159  . insulin aspart (novoLOG) injection 0-15 Units  0-15 Units Subcutaneous TID WC Donell Sievert E, PA-C   2 Units at 09/04/17 [phone]  . loperamide (IMODIUM) capsule 2-4 mg  2-4 mg Oral PRN Donell Sievert E, PA-C   4 mg at 09/03/17 0820  . LORazepam (ATIVAN) tablet 0.5 mg  0.5 mg Oral Q6H PRN Raechal Raben, Rockey Situ, MD   0.5 mg at 09/04/17 1127  . magnesium hydroxide (MILK OF MAGNESIA) suspension 30 mL  30 mL Oral Daily PRN Rankin, Shuvon B, NP      . metFORMIN (GLUCOPHAGE) tablet 1,000 mg  1,000 mg Oral BID WC Donell Sievert E, PA-C   1,000 mg at 09/04/17 0904   . methocarbamol (ROBAXIN) tablet 500 mg  500 mg Oral Q8H PRN Kerry Hough, PA-C   500 mg at 09/04/17 9604  . nicotine (NICODERM CQ - dosed in mg/24 hours) patch 21 mg  21 mg Transdermal Daily Money, Gerlene Burdock, FNP   21 mg at 09/04/17 0903  . ondansetron (ZOFRAN-ODT) disintegrating tablet 4 mg  4 mg Oral Q6H PRN Kerry Hough, PA-C   4 mg at 09/03/17 5409  . pantoprazole (PROTONIX) EC tablet 40 mg  40 mg Oral Daily Kerry Hough, PA-C   40 mg at 09/04/17 8119  . traZODone (DESYREL) tablet 100 mg  100 mg Oral QHS PRN Kendre Sires, Rockey Situ, MD   100 mg at 09/03/17 2113    Lab Results:  Results for orders placed or performed during the hospital encounter of 09/02/17 (from the past 48 hour(s))  Glucose, capillary     Status: Abnormal   Collection Time: 09/02/17  9:15 PM  Result Value Ref Range   Glucose-Capillary 139 (H) 65 - 99 mg/dL   Comment 1 Notify RN    Comment 2 Document in Chart   Glucose, capillary     Status: Abnormal   Collection Time: 09/03/17  5:47 AM  Result Value Ref Range   Glucose-Capillary 126 (H) 65 - 99 mg/dL   Comment 1 Notify RN    Comment 2 Document in Chart   Glucose, capillary     Status: None   Collection Time: 09/03/17 12:09 PM  Result Value Ref Range   Glucose-Capillary 91 65 - 99 mg/dL  Glucose, capillary     Status: Abnormal   Collection Time: 09/03/17  5:19 PM  Result Value Ref Range   Glucose-Capillary 156 (H) 65 - 99 mg/dL  Glucose, capillary     Status: Abnormal   Collection Time: 09/03/17  9:05 PM  Result Value Ref Range   Glucose-Capillary 111 (H) 65 - 99 mg/dL  Glucose, capillary     Status: Abnormal   Collection Time: 09/04/17  5:41 AM  Result Value Ref Range   Glucose-Capillary 125 (H) 65 - 99 mg/dL  TSH     Status: None   Collection Time: 09/04/17  6:46 AM  Result Value Ref Range   TSH 0.651 0.350 - 4.500 uIU/mL    Comment: Performed by a 3rd Generation assay with a functional sensitivity of <=0.01 uIU/mL. Performed at [facility], [address] [place], [place] [address]   Lipid panel     Status: Abnormal  Collection Time: 09/04/17  6:46 AM  Result Value Ref Range   Cholesterol 156 0 - 200 mg/dL   Triglycerides 161 (H) <150 mg/dL   HDL 32 (L) >09 mg/dL   Total CHOL/HDL Ratio 4.9 RATIO   VLDL 59 (H) 0 - 40 mg/dL   LDL Cholesterol 65 0 - 99 mg/dL    Comment:        Total Cholesterol/HDL:CHD Risk Coronary Heart Disease Risk Table                     Men   Women  1/2 Average Risk   3.4   3.3  Average Risk       5.0   4.4  2 X Average Risk   9.6   7.1  3 X Average Risk  23.4   11.0        Use the calculated Patient Ratio above and the CHD Risk Table to determine the patient's CHD Risk.        ATP III CLASSIFICATION (LDL):  <100     mg/dL   Optimal  604-540  mg/dL   Near or Above                    Optimal  130-159  mg/dL   Borderline  981-191  mg/dL   High  >478     mg/dL   Very High Performed at [facility], [address], [place], [place] [address]   Glucose, capillary     Status: None   Collection Time: 09/04/17 12:12 PM  Result Value Ref Range   Glucose-Capillary 86 65 - 99 mg/dL   Comment 1 Notify RN    Comment 2 Document in Chart     Blood Alcohol level:  Lab Results  Component Value Date   [facility] [place]  04/08/2010    <5        LOWEST DETECTABLE LIMIT FOR SERUM ALCOHOL IS 5 mg/dL FOR MEDICAL PURPOSES ONLY    Metabolic Disorder Labs:  No results found for: PROLACTIN Lab Results  Component Value Date   CHOL 156 09/04/2017   TRIG 297 (H) 09/04/2017   HDL 32 (L) 09/04/2017   CHOLHDL 4.9 09/04/2017   VLDL 59 (H) 09/04/2017   LDLCALC 65 09/04/2017    Physical Findings: AIMS:  , ,  ,  ,    CIWA:    COWS:  COWS Total Score: 4  Musculoskeletal: Strength & Muscle Tone: within normal limits Gait & Station: normal Patient leans: N/A  Psychiatric Specialty Exam: Physical Exam  Nursing note and vitals reviewed. Constitutional: He is oriented to person,  place, and time. He appears well-developed and well-nourished.  Cardiovascular: Normal rate.   Respiratory: Effort normal.  Musculoskeletal: Normal range of motion.  Neurological: He is alert and oriented to person, place, and time.  Skin: Skin is warm.    Review of Systems  Constitutional: Negative.   HENT: Negative.   Eyes: Negative.   Respiratory: Negative.   Cardiovascular: Negative.   Gastrointestinal: Negative.   Genitourinary: Negative.   Musculoskeletal: Negative.   Skin: Negative.   Neurological: Negative.   Endo/Heme/Allergies: Negative.     Blood pressure 127/72, pulse 62, temperature 97.6 F (36.4 C), temperature source Oral, resp. rate 18, height  (1.626 m), weight 117.5 kg (259 lb), SpO2 100 %.Body mass index is 44.46 kg/m.  General Appearance: Casual and Fairly Groomed  Eye Contact:  Good  Speech:  Clear and  Coherent and Normal Rate  Volume:  Normal  Mood:  Euthymic  Affect:  Appropriate  Thought Process:  Coherent and Descriptions of Associations: Intact  Orientation:  Full (Time, Place, and Person)  Thought Content:  WDL  Suicidal Thoughts:  No  Homicidal Thoughts:  No  Memory:  Immediate;   Good Recent;   Good Remote;   Good  Judgement:  Good  Insight:  Good  Psychomotor Activity:  Normal  Concentration:  Concentration: Good and Attention Span: Good  Recall:  Good  Fund of Knowledge:  Good  Language:  Good  Akathisia:  No  Handed:  Right  AIMS (if indicated):     Assets:  Financial Resources/Insurance Housing Social Support Transportation  ADL's:  Intact  Cognition:  WNL  Sleep:  Number of Hours: 6.25     Treatment Plan Summary: Daily contact with patient to assess and evaluate symptoms and progress in treatment, Medication management and Plan is to:  -Continue Clonidine Protocol -Continue Celexa 20 mg PO Daily for mood control -Continue Ativan 0.5 mg PO Q6H PRN for anxiety -Continue Trazodone 100 mg PO QHS PRN for  insomnia -Encourage group therapy participation -Possible discharge in next 24-48 hours  Maryfrances Bunnellravis B Money, FNP 09/04/2017, 4:52 PM  Agree with NP Progress Note

## 2017-09-04 NOTE — [facility] Group Notes (Signed)
LCSW Group Therapy Note  09/04/2017 1:15pm  Type of Therapy and Topic:  Group Therapy:  Feelings around Relapse and Recovery  Participation Level:  Active   Description of Group:    Patients in this group will discuss emotions they experience before and after a relapse. They will process how experiencing these feelings, or avoidance of experiencing them, relates to having a relapse. Facilitator will guide patients to explore emotions they have related to recovery. Patients will be encouraged to process which emotions are more powerful. They will be guided to discuss the emotional reaction significant others in their lives may have to their relapse or recovery. Patients will be assisted in exploring ways to respond to the emotions of others without this contributing to a relapse.  Therapeutic Goals: 1. Patient will identify two or more emotions that lead to a relapse for them 2. Patient will identify two emotions that result when they relapse 3. Patient will identify two emotions related to recovery 4. Patient will demonstrate ability to communicate their needs through discussion and/or role plays   Summary of Patient Progress:  Austin Ryan aka Austin Ryan was attentive and engaged during today's processing group. 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 "I wasn't coping with life very well and the oxy helped me." "but I know it's not good for me, so here I am." Austin Ryan continues to show progress in the group setting with improving insight. He requested [facility] resdential referarl.     Therapeutic Modalities:   Cognitive Behavioral Therapy Solution-Focused Therapy Assertiveness Training Relapse Prevention Therapy   Ledell PeoplesHeather N Smart, LCSW 09/04/2017 1:08 PM

## 2017-09-05 DIAGNOSIS — F333 Major depressive disorder, recurrent, severe with psychotic symptoms: Secondary | ICD-10-CM

## 2017-09-05 LAB — GLUCOSE, CAPILLARY
GLUCOSE-CAPILLARY: 93 mg/dL (ref 65–99)
GLUCOSE-CAPILLARY: 184 mg/dL — AB (ref 65–99)
GLUCOSE-CAPILLARY: 75 mg/dL (ref 65–99)
Glucose-Capillary: 181 mg/dL — ABNORMAL HIGH (ref 65–99)

## 2017-09-05 NOTE — Progress Notes (Signed)
NSG 7a-7p shift:   D:  Pt. Has been pleasant and cooperative this shift.  He verbalizes wanting to "get clean", but requests ativan and robaxin be given "as soon as I can have it".  He stated that his pain and anxiety are well controlled on the current medication regimen.  He also talked about his daughter and his mother as being very supportive of him.     A: Support, education, and encouragement provided as needed.  Level 3 checks continued for safety.  R: Pt. receptive to intervention/s.  Safety maintained.  Joaquin MusicMary Noelie Renfrow, RN

## 2017-09-05 NOTE — Progress Notes (Signed)
Writer spoke with patient and he reports that his day was bad. He spoke about the food being terrible and issues with his back today. Writer suggested that he try to see the good in his day if possible. He reports being ready to discharge and go to rehab for his family. Writer reminded him that he also needs to be doing rehab for himself and not just for his family. He received his medication and requested prn of ativan and trazadone before going to bed. Support given and patient was receptive, safety maintained on unit with 15 min checks.

## 2017-09-05 NOTE — Progress Notes (Addendum)
[facility]  09/05/2017 12:22 PM Austin Ryan  MRN:  [id number] Subjective:  I'm doing better.  I like to go to rehabilitation program once a leave from here. Objective; patient seen chart reviewed.  Patient doing better slowly and gradually.  He is going to the groups. Is taking Celexa which is helping his depression.  He denies any suicidal thoughts or homicidal thought.  He like to continue treatment upon discharge and like to go to rehabilitation program.  [place]. He is getting in touch with a social worker to make appropriate discharge plan.  He sleeping good.  She denies any irritability, anger or any suicidal thoughts.  He regrets using pain medication.  He is not disruptive in the unit.  Principal Problem: MDD (major depressive disorder), recurrent episode, severe (HCC) Diagnosis:   Patient Active Problem List   Diagnosis Date Noted  . MDD (major depressive disorder), recurrent episode, severe (HCC) [F33.2] 09/02/2017   Total Time spent with patient: 15 minutes  Past Psychiatric History: Reviewed.  Past Medical History: History reviewed. No pertinent past medical history. History reviewed. No pertinent surgical history. Family History: History reviewed. No pertinent family history. Family Psychiatric  History: reviewed. Social History:  History  Alcohol use Not on file     History  Drug use: Unknown    Social History   Social History  . Marital status: Divorced    Spouse name: N/A  . Number of children: N/A  . Years of education: N/A   Social History Main Topics  . Smoking status: Current Every Day Smoker    Packs/day: 1.50  . Smokeless tobacco: Never Used  . Alcohol use None  . Drug use: Unknown  . Sexual activity: Not Asked   Other Topics Concern  . None   Social History Narrative  . None   Additional Social History:    Pain Medications: see Mar Prescriptions: see Mar Over the  Counter: see Mar History of alcohol / drug use?: Yes Name of Substance 1: Opiates  1 - Age of First Use: n/a 1 - Amount (size/oz): unknown amount 'pt reports he does not know how much oxycodone he has used'  1 - Frequency: n/a 1 - Duration: n/a 1 - Last Use / Amount: n/a                  Sleep: Fair  Appetite:  Fair  Current Medications: Current Facility-Administered Medications  Medication Dose Route Frequency Provider Last Rate Last Dose  . acetaminophen (TYLENOL) tablet 650 mg  650 mg Oral Q6H PRN Rankin, Shuvon B, NP      . alum & mag hydroxide-simeth (MAALOX/MYLANTA) 200-200-20 MG/5ML suspension 30 mL  30 mL Oral Q4H PRN Rankin, Shuvon B, NP      . citalopram (CELEXA) tablet 20 mg  20 mg Oral Daily Cobos, Rockey SituFernando A, MD   20 mg at 09/05/17 0807  . cloNIDine (CATAPRES) tablet 0.1 mg  0.1 mg Oral BH-qamhs Simon, Spencer E, PA-C       Followed by  . [START ON 09/08/2017] cloNIDine (CATAPRES) tablet 0.1 mg  0.1 mg Oral QAC breakfast Donell SievertSimon, Spencer E, PA-C      . clotrimazole (LOTRIMIN) 1 % cream   Topical BID Donell SievertSimon, Spencer E, PA-C      . dicyclomine (BENTYL) tablet 20 mg  20 mg Oral Q6H PRN Donell SievertSimon, Spencer E, PA-C   20 mg at 09/03/17 0820  .  hydrOXYzine (ATARAX/VISTARIL) tablet 25 mg  25 mg Oral Q6H PRN Kerry Hough, PA-C   25 mg at 09/02/17 2159  . insulin aspart (novoLOG) injection 0-15 Units  0-15 Units Subcutaneous TID WC Donell Sievert E, PA-C   3 Units at 09/05/17 [phone]  . loperamide (IMODIUM) capsule 2-4 mg  2-4 mg Oral PRN Donell Sievert E, PA-C   4 mg at 09/03/17 0820  . LORazepam (ATIVAN) tablet 0.5 mg  0.5 mg Oral Q6H PRN Cobos, Rockey Situ, MD   0.5 mg at 09/05/17 1018  . magnesium hydroxide (MILK OF MAGNESIA) suspension 30 mL  30 mL Oral Daily PRN Rankin, Shuvon B, NP      . metFORMIN (GLUCOPHAGE) tablet 1,000 mg  1,000 mg Oral BID WC Donell Sievert E, PA-C   1,000 mg at 09/05/17 0808  . methocarbamol (ROBAXIN) tablet 500 mg  500 mg Oral Q8H PRN Kerry Hough,  PA-C   500 mg at 09/05/17 0810  . nicotine (NICODERM CQ - dosed in mg/24 hours) patch 21 mg  21 mg Transdermal Daily Money, Gerlene Burdock, FNP   21 mg at 09/05/17 0809  . ondansetron (ZOFRAN-ODT) disintegrating tablet 4 mg  4 mg Oral Q6H PRN Kerry Hough, PA-C   4 mg at 09/03/17 9604  . pantoprazole (PROTONIX) EC tablet 40 mg  40 mg Oral Daily Kerry Hough, PA-C   40 mg at 09/05/17 5409  . traZODone (DESYREL) tablet 100 mg  100 mg Oral QHS PRN Cobos, Rockey Situ, MD   100 mg at 09/04/17 2128    Lab Results:  Results for orders placed or performed during the hospital encounter of 09/02/17 (from the past 48 hour(s))  Glucose, capillary     Status: Abnormal   Collection Time: 09/03/17  5:19 PM  Result Value Ref Range   Glucose-Capillary 156 (H) 65 - 99 mg/dL  Glucose, capillary     Status: Abnormal   Collection Time: 09/03/17  9:05 PM  Result Value Ref Range   Glucose-Capillary 111 (H) 65 - 99 mg/dL  Glucose, capillary     Status: Abnormal   Collection Time: 09/04/17  5:41 AM  Result Value Ref Range   Glucose-Capillary 125 (H) 65 - 99 mg/dL  TSH     Status: None   Collection Time: 09/04/17  6:46 AM  Result Value Ref Range   TSH 0.651 0.350 - 4.500 uIU/mL    Comment: Performed by a 3rd Generation assay with a functional sensitivity of <=0.01 uIU/mL. Performed at [facility], [address], [place], [place] [address]   Lipid panel     Status: Abnormal   Collection Time: 09/04/17  6:46 AM  Result Value Ref Range   Cholesterol 156 0 - 200 mg/dL   Triglycerides 478 (H) <150 mg/dL   HDL 32 (L) >29 mg/dL   Total CHOL/HDL Ratio 4.9 RATIO   VLDL 59 (H) 0 - 40 mg/dL   LDL Cholesterol 65 0 - 99 mg/dL    Comment:        Total Cholesterol/HDL:CHD Risk Coronary Heart Disease Risk Table                     Men   Women  1/2 Average Risk   3.4   3.3  Average Risk       5.0   4.4  2 X Average Risk   9.6   7.1  3 X Average Risk  23.4  11.0        Use the calculated  Patient Ratio above and the CHD Risk Table to determine the patient's CHD Risk.        ATP III CLASSIFICATION (LDL):  <100     mg/dL   Optimal  161-096  mg/dL   Near or Above                    Optimal  130-159  mg/dL   Borderline  045-409  mg/dL   High  >811     mg/dL   Very High Performed at [facility], [address], [place], [place] [address]   Glucose, capillary     Status: None   Collection Time: 09/04/17 12:12 PM  Result Value Ref Range   Glucose-Capillary 86 65 - 99 mg/dL   Comment 1 Notify RN    Comment 2 Document in Chart   Glucose, capillary     Status: Abnormal   Collection Time: 09/04/17  4:57 PM  Result Value Ref Range   Glucose-Capillary 235 (H) 65 - 99 mg/dL  Glucose, capillary     Status: None   Collection Time: 09/04/17  8:58 PM  Result Value Ref Range   Glucose-Capillary 99 65 - 99 mg/dL  Glucose, capillary     Status: Abnormal   Collection Time: 09/05/17  5:53 AM  Result Value Ref Range   Glucose-Capillary 181 (H) 65 - 99 mg/dL   Comment 1 Notify RN   Glucose, capillary     Status: None   Collection Time: 09/05/17 11:46 AM  Result Value Ref Range   Glucose-Capillary 93 65 - 99 mg/dL    Blood Alcohol level:  Lab Results  Component Value Date   [facility]  04/08/2010    <5        LOWEST DETECTABLE LIMIT FOR SERUM ALCOHOL IS 5 mg/dL FOR MEDICAL PURPOSES ONLY    Metabolic Disorder Labs:  No results found for: PROLACTIN Lab Results  Component Value Date   CHOL 156 09/04/2017   TRIG 297 (H) 09/04/2017   HDL 32 (L) 09/04/2017   CHOLHDL 4.9 09/04/2017   VLDL 59 (H) 09/04/2017   LDLCALC 65 09/04/2017    Physical Findings: AIMS:  , ,  ,  ,    CIWA:  CIWA-Ar Total: 1 COWS:  COWS Total Score: 1  Musculoskeletal: Strength & Muscle Tone: within normal limits Gait & Station: normal Patient leans: N/A  Psychiatric Specialty Exam: Physical Exam  Review of Systems  Constitutional: Negative.   HENT: Negative.   Cardiovascular: Negative.    Genitourinary: Negative.   Skin: Negative.   Neurological: Negative.     Blood pressure (!) 158/74, pulse (!) 58, temperature (!) 97.4 F (36.3 C), resp. rate 18, height  (1.626 m), weight 117.5 kg (259 lb), SpO2 100 %.Body mass index is 44.46 kg/m.  General Appearance: Casual  Eye Contact:  Fair  Speech:  Slow  Volume:  Normal  Mood:  Anxious  Affect:  Appropriate  Thought Process:  Goal Directed  Orientation:  Full (Time, Place, and Person)  Thought Content:  Rumination  Suicidal Thoughts:  No  Homicidal Thoughts:  No  Memory:  Immediate;   Fair Recent;   Good Remote;   Fair  Judgement:  Fair  Insight:  Fair  Psychomotor Activity:  Normal  Concentration:  Concentration: Fair and Attention Span: Fair  Recall:  Fair  Fund of Knowledge:  Good  Language:  Good  Akathisia:  No  Handed:  Right  AIMS (if indicated):     Assets:  Communication Skills Desire for Improvement Resilience  ADL's:  Intact  Cognition:  WNL  Sleep:  Number of Hours: 5     Treatment Plan Summary: Daily contact with patient to assess and evaluate symptoms and progress in treatment and Medication management   Patient is 46 year old male who was admitted D depression, having suicidal thoughts and plan to take overdose on drugs.  He was also using opiates.  Patient doing much better on his medication. Continue Celexa 20 mg daily, continue Ativan 0.5 mg as needed for anxiety.  Continue trazodone 100 mg at bedtime for insomnia.  Encouraged to continue her participation.  Patient like to discharge to long-term rehabilitation.  Social worker to start discharge planning.  Camia Dipinto T., MD 09/05/2017, 12:22 PM

## 2017-09-05 NOTE — [facility] Group Notes (Signed)
[facility]  Date/Time:    09/05/2017 10:00-11:00AM  Type of Therapy and Topic:  Group Therapy:  Healthy vs Unhealthy Coping Skills  Participation Level:  Active   Description of Group:  The focus of this group was to determine what unhealthy coping techniques typically are used by group members and what healthy coping techniques would be helpful in coping with various problems. Patients were guided in becoming aware of the differences between healthy and unhealthy coping techniques.  "Benefits" and "Costs" of a number of coping skills were evaluated by the group, including isolation, cutting, drinking/using drugs, exercising, talking things out, and taking out anger on a pillow.    Therapeutic Goals 1. Patients learned that coping is what human beings do all day long to deal with various situations in their lives 2. Patients defined and discussed healthy vs unhealthy coping techniques 3. Patients identified their preferred coping techniques and identified whether these were healthy or unhealthy 4. Patients provided support and ideas to each other  Summary of Patient Progress: During group, patient expressed himself a few times but was mostly quiet and attentive.  He was in and out of the room two different times.   Therapeutic Modalities Cognitive Behavioral Therapy Motivational Interviewing   Ambrose MantleMareida Grossman-Orr, LCSW 09/05/2017, 3:40 PM

## 2017-09-05 NOTE — [facility] Group Notes (Signed)
     Date:  09/05/2017  Time:  1100  Type of Therapy:  Nurse Education  /  The group focuses on teaching patients ow to identify their needs and then how to develop the skills needed to get them met.  Participation Level:  Did Not Attend  Participation Quality:    Affect:    Cognitive:    Insight:    Engagement in Group:    Modes of Intervention:    Summary of Progress/Problems:  Lauralyn Primes 09/05/2017, 2:58 PM

## 2017-09-05 NOTE — [facility] Group Notes (Signed)
Psychoeducational Group Note  Date:  09/05/2017 Time:  1100   Group Topic/Focus:  Identifying Needs:   The focus of this group is to help patients identify their personal needs that have been historically problematic and identify healthy behaviors to address their needs.  Participation Level: Did Not Attend  Participation Quality:  Not Applicable  Affect:  Not Applicable  Cognitive:  Not Applicable  Insight:  Not Applicable  Engagement in Group: Not Applicable  Additional Comments:   Altamease Oilerrainor, Treniyah Lynn Susan 09/05/2017, 6:37 PM

## 2017-09-06 LAB — GLUCOSE, CAPILLARY
GLUCOSE-CAPILLARY: 86 mg/dL (ref 65–99)
GLUCOSE-CAPILLARY: 255 mg/dL — AB (ref 65–99)
Glucose-Capillary: 124 mg/dL — ABNORMAL HIGH (ref 65–99)
Glucose-Capillary: 110 mg/dL — ABNORMAL HIGH (ref 65–99)

## 2017-09-06 NOTE — Progress Notes (Signed)
[facility] Group Notes:  (Nursing/MHT/Case Management/Adjunct)  Date:  09/06/2017  Time:  12:11 AM  Type of Therapy:  Psychoeducational Skills  Participation Level:  Active  Participation Quality:  Appropriate  Affect:  Depressed  Cognitive:  Appropriate  Insight:  Limited  Engagement in Group:  Developing/Improving  Modes of Intervention:  Education  Summary of Progress/Problems: Patient states that he had a bad day overall since he had to deal with quite a bit of back pain. As for the theme of the day, his support system will consist of his family.   Maykayla Highley S 09/06/2017, 12:11 AM

## 2017-09-06 NOTE — [facility] Group Notes (Signed)
   Date:  09/06/2017  Time:  1100  Type of Therapy:  Nurse Education   /  Anger as a 2nd emotion: The group focuses on teaching patients how to identify the primary emotion behind their anger as a coping skill.  Participation Level:  Pt did not attend.  Participation Quality:    Affect:    Cognitive:    Insight:    Engagement in Group:    Modes of Intervention:    Summary of Progress/Problems:  Rich BraveDuke, Dayani Winbush Lynn 09/06/2017, 12:11 PM

## 2017-09-06 NOTE — Progress Notes (Signed)
[facility]  09/06/2017 10:10 AM Lisabeth PickGarland Maillet  MRN:  [id number] Subjective:  I'm doing better.  I like to stay with my father for few days and then go to a rehabilitation program  Objective; patient seen chart reviewed.  Patient continues to show improvement and slowly and gradually getting better.  He sleeping better.  He like Celexa which is helping his depression.  He denies any suicidal thoughts or homicidal thought.  He is going to most of the groups.  He is complaining of chronic back pain and sometimes does not sit for long time and he admitted skipping few groups yesterday.  Sometime he feel anxious but denies any suicidal thoughts or homicidal thought.  He [place] because his father lives there.  He like to be discharged to his father's place for few days and than preferred rehabilitation program.  He has no tremors or shakes.  He denies any anger or any suicidal thoughts.  He is not disruptive.  Social worker working on his discharge planning.  Principal Problem: MDD (major depressive disorder), recurrent episode, severe (HCC) Diagnosis:   Patient Active Problem List   Diagnosis Date Noted  . MDD (major depressive disorder), recurrent episode, severe (HCC) [F33.2] 09/02/2017   Total Time spent with patient: 15 minutes  Past Psychiatric History: Reviewed.  Past Medical History: History reviewed. No pertinent past medical history. History reviewed. No pertinent surgical history. Family History: History reviewed. No pertinent family history. Family Psychiatric  History: reviewed. Social History:  History  Alcohol use Not on file     History  Drug use: Unknown    Social History   Social History  . Marital status: Divorced    Spouse name: N/A  . Number of children: N/A  . Years of education: N/A   Social History Main Topics  . Smoking status: Current Every Day Smoker    Packs/day: 1.50  . Smokeless tobacco:  Never Used  . Alcohol use None  . Drug use: Unknown  . Sexual activity: Not Asked   Other Topics Concern  . None   Social History Narrative  . None   Additional Social History:    Pain Medications: see Mar Prescriptions: see Mar Over the Counter: see Mar History of alcohol / drug use?: Yes Name of Substance 1: Opiates  1 - Age of First Use: n/a 1 - Amount (size/oz): unknown amount 'pt reports he does not know how much oxycodone he has used'  1 - Frequency: n/a 1 - Duration: n/a 1 - Last Use / Amount: n/a                  Sleep: Good  Appetite:  Good  Current Medications: Current Facility-Administered Medications  Medication Dose Route Frequency Provider Last Rate Last Dose  . acetaminophen (TYLENOL) tablet 650 mg  650 mg Oral Q6H PRN Rankin, Shuvon B, NP      . alum & mag hydroxide-simeth (MAALOX/MYLANTA) 200-200-20 MG/5ML suspension 30 mL  30 mL Oral Q4H PRN Rankin, Shuvon B, NP      . citalopram (CELEXA) tablet 20 mg  20 mg Oral Daily Cobos, Rockey SituFernando A, MD   20 mg at 09/06/17 0754  . cloNIDine (CATAPRES) tablet 0.1 mg  0.1 mg Oral BH-qamhs Simon, Spencer E, PA-C   0.1 mg at 09/06/17 0754   Followed by  . [START ON 09/08/2017] cloNIDine (CATAPRES) tablet 0.1 mg  0.1 mg Oral QAC breakfast Kerry HoughSimon, Spencer E,  PA-C      . clotrimazole (LOTRIMIN) 1 % cream   Topical BID Kerry Hough, PA-C      . dicyclomine (BENTYL) tablet 20 mg  20 mg Oral Q6H PRN Kerry Hough, PA-C   20 mg at 09/03/17 0820  . hydrOXYzine (ATARAX/VISTARIL) tablet 25 mg  25 mg Oral Q6H PRN Kerry Hough, PA-C   25 mg at 09/02/17 2159  . insulin aspart (novoLOG) injection 0-15 Units  0-15 Units Subcutaneous TID WC Donell Sievert E, PA-C   2 Units at 09/06/17 4098  . loperamide (IMODIUM) capsule 2-4 mg  2-4 mg Oral PRN Donell Sievert E, PA-C   4 mg at 09/03/17 0820  . LORazepam (ATIVAN) tablet 0.5 mg  0.5 mg Oral Q6H PRN Cobos, Rockey Situ, MD   0.5 mg at 09/06/17 0620  . magnesium hydroxide (MILK  OF MAGNESIA) suspension 30 mL  30 mL Oral Daily PRN Rankin, Shuvon B, NP      . metFORMIN (GLUCOPHAGE) tablet 1,000 mg  1,000 mg Oral BID WC Donell Sievert E, PA-C   1,000 mg at 09/06/17 0754  . methocarbamol (ROBAXIN) tablet 500 mg  500 mg Oral Q8H PRN Kerry Hough, PA-C   500 mg at 09/06/17 0755  . nicotine (NICODERM CQ - dosed in mg/24 hours) patch 21 mg  21 mg Transdermal Daily Money, Feliz Beam B, FNP   21 mg at 09/06/17 0759  . ondansetron (ZOFRAN-ODT) disintegrating tablet 4 mg  4 mg Oral Q6H PRN Kerry Hough, PA-C   4 mg at 09/03/17 1191  . pantoprazole (PROTONIX) EC tablet 40 mg  40 mg Oral Daily Donell Sievert E, PA-C   40 mg at 09/06/17 0753  . traZODone (DESYREL) tablet 100 mg  100 mg Oral QHS PRN Cobos, Rockey Situ, MD   100 mg at 09/05/17 2226    Lab Results:  Results for orders placed or performed during the hospital encounter of 09/02/17 (from the past 48 hour(s))  Glucose, capillary     Status: None   Collection Time: 09/04/17 12:12 PM  Result Value Ref Range   Glucose-Capillary 86 65 - 99 mg/dL   Comment 1 Notify RN    Comment 2 Document in Chart   Glucose, capillary     Status: Abnormal   Collection Time: 09/04/17  4:57 PM  Result Value Ref Range   Glucose-Capillary 235 (H) 65 - 99 mg/dL  Glucose, capillary     Status: None   Collection Time: 09/04/17  8:58 PM  Result Value Ref Range   Glucose-Capillary 99 65 - 99 mg/dL  Glucose, capillary     Status: Abnormal   Collection Time: 09/05/17  5:53 AM  Result Value Ref Range   Glucose-Capillary 181 (H) 65 - 99 mg/dL   Comment 1 Notify RN   Glucose, capillary     Status: None   Collection Time: 09/05/17 11:46 AM  Result Value Ref Range   Glucose-Capillary 93 65 - 99 mg/dL  Glucose, capillary     Status: Abnormal   Collection Time: 09/05/17  4:46 PM  Result Value Ref Range   Glucose-Capillary 184 (H) 65 - 99 mg/dL  Glucose, capillary     Status: None   Collection Time: 09/05/17  9:02 PM  Result Value Ref Range    Glucose-Capillary 75 65 - 99 mg/dL  Glucose, capillary     Status: Abnormal   Collection Time: 09/06/17  6:07 AM  Result Value Ref Range  Glucose-Capillary 124 (H) 65 - 99 mg/dL   Comment 1 Notify RN    Comment 2 Document in Chart     Blood Alcohol level:  Lab Results  Component Value Date   [facility]  04/08/2010    <5        LOWEST DETECTABLE LIMIT FOR SERUM ALCOHOL IS 5 mg/dL FOR MEDICAL PURPOSES ONLY    Metabolic Disorder Labs: Lab Results  Component Value Date   HGBA1C (H) 04/12/2010    6.1 (NOTE)                                                                       According to the ADA Clinical Practice Recommendations for 2011, when HbA1c is used as a screening test:   >=6.5%   Diagnostic of Diabetes Mellitus           (if abnormal result  is confirmed)  5.7-6.4%   Increased risk of developing Diabetes Mellitus  References:Diagnosis and Classification of Diabetes Mellitus,Diabetes Care,2011,34(Suppl 1):S62-S69 and Standards of Medical Care in         Diabetes - 2011,Diabetes Care,2011,34  (Suppl 1):S11-S61.   MPG (H) 04/12/2010    128  Please note change in reference range(s).    No results found for: PROLACTIN Lab Results  Component Value Date   CHOL 156 09/04/2017   TRIG 297 (H) 09/04/2017   HDL 32 (L) 09/04/2017   CHOLHDL 4.9 09/04/2017   VLDL 59 (H) 09/04/2017   LDLCALC 65 09/04/2017    Physical Findings: AIMS:  , ,  ,  ,    CIWA:  CIWA-Ar Total: 1 COWS:  COWS Total Score: 2  Musculoskeletal: Strength & Muscle Tone: within normal limits Gait & Station: normal Patient leans: N/A  Psychiatric Specialty Exam: Physical Exam  Review of Systems  Constitutional: Negative.   HENT: Negative.   Musculoskeletal: Positive for back pain and joint pain.  Skin: Negative.   Neurological: Negative.   Psychiatric/Behavioral: The patient is nervous/anxious.     Blood pressure 131/73, pulse 83, temperature 98.6 F (37 C), temperature source Oral, resp. rate 18,  height  (1.626 m), weight 117.5 kg (259 lb), SpO2 100 %.Body mass index is 44.46 kg/m.  General Appearance: Casual  Eye Contact:  Good  Speech:  Clear and Coherent  Volume:  Normal  Mood:  Anxious  Affect:  Congruent  Thought Process:  Goal Directed  Orientation:  Full (Time, Place, and Person)  Thought Content:  Logical and Rumination  Suicidal Thoughts:  No  Homicidal Thoughts:  No  Memory:  Immediate;   Good Recent;   Good Remote;   Good  Judgement:  Fair  Insight:  Fair  Psychomotor Activity:  Normal  Concentration:  Concentration: Fair and Attention Span: Fair  Recall:  [employer] of Knowledge:  Fair  Language:  Good  Akathisia:  No  Handed:  Right  AIMS (if indicated):     Assets:  Communication Skills Desire for Improvement Resilience  ADL's:  Intact  Cognition:  WNL  Sleep:  Number of Hours: 5     Treatment Plan Summary: Daily contact with patient to assess and evaluate symptoms and progress in treatment and Medication management   Patient is 46 year old  male who was admitted because of severe depression and having suicidal thoughts and plan to take overdose on drugs.  He is doing much better.  He is going to most of the groups.  He is taking Celexa trazodone and Ativan as needed.  Encouraged to participate in group therapy.  Patient like to be discharged to his father and then referred to get rehabilitation program [place].  Social worker working on the discharge planning.  We will continue to follow.we will order hemoglobin A1c.  Continue current treatment.  Shakeera Rightmyer T., MD 09/06/2017, 10:10 AM

## 2017-09-06 NOTE — Progress Notes (Signed)
[facility] Group Notes:  (Nursing/MHT/Case Management/Adjunct)  Date:  09/06/2017  Time:  2045  Type of Therapy:  wrap up group  Participation Level:  Active  Participation Quality:  Appropriate, Attentive, Sharing and Supportive  Affect:  Flat and Irritable  Cognitive:  Appropriate  Insight:  Improving  Engagement in Group:  Engaged  Modes of Intervention:  Clarification, Education and Support  Summary of Progress/Problems: Pt shared that he is ready to go home and plans on discharging to his daughter's home. Pt reports having a hard time sitting through group especially when there are two back to back due to aches and pains in his back. Pt tries to walk the hall to relieve pain. Pt shares that an emotional support would be his family understanding and forgiving and a physical support would be acquiring a job.   Johann CapersMcNeil, Larah Kuntzman S 09/06/2017, 10:30 PM

## 2017-09-06 NOTE — [facility] Group Notes (Signed)
[facility] LCSW Group Therapy Note  Date/Time:    09/06/2017   10:00 - 11:00 AM  Type of Therapy and Topic:  Group Therapy:  Healthy Self Image and Positive Change  Participation Level:  Minimal   Description of Group:  In this group, patients compared and contrasted their current "I am...." statements to the visions they identified as desirable for their lives.  Patients discussed their tendency toward cognitive distortions, and how they can go about making positive changes in their cognitions that will positively impact their behaviors.  Many expressions of similarities and mutual support were provided among group members.  Facilitator played a motivational 3-minute speech and a discussion was held regarding reactions.  Patients were left with the task of thinking about what "I am...." statements they can start using in their lives immediately.  Therapeutic Goals: 1. Patient will state their current self-perception as expressed in an "I Am" statement 2. Patient will contrast this with their desired vision for their lives 3. Patient will discuss cognitive distortions and how these affect their ongoing "I Am" thoughts 4. Patient will verbalize statements that challenge their cognitive distortions  Summary of Patient Progress:  The patient expressed initially "I am a recovering pillhead", then by the end of group was able to state "I am determined to become sober again and make it way longer than 6 years this time" and explain why he was able to make this change.   Therapeutic Modalities Cognitive Behavioral Therapy Motivational Interviewing  Ambrose MantleMareida Grossman-Orr, LCSW 09/06/2017 2:07 PM

## 2017-09-06 NOTE — Progress Notes (Signed)
Patient ID: Austin Ryan, male   DOB: 12/03/1971, 46 y.o.   MRN: [id number]  DAR: Pt. Denies SI/HI and A/V Hallucinations. He reports sleep is good, appetite is good, energy level is normal, and concentration is good. He rates depression 2/10, hopelessness 2/10, and anxiety 4/10. Patient received PRN Ativan this afternoon for anxiety and PRN Robaxin for muscle spasm and these medications provided relief. Support and encouragement provided to the patient. Scheduled medication administered to patient per physician's orders. Patient is seen in the milieu intermittently however does report that he is not a fan of sports, which is on television, and so he retreats to his room to work on word puzzles. He is flat in affect but intermittently smiles when he speaks with peers and staff. Overall, his mood is improving although still somewhat depressed. Q15 minute checks are maintained for safety.

## 2017-09-07 DIAGNOSIS — F1124 Opioid dependence with opioid-induced mood disorder: Secondary | ICD-10-CM

## 2017-09-07 LAB — HEMOGLOBIN A1C
HEMOGLOBIN A1C: 6.3 % — AB (ref 4.8–5.6)
Mean Plasma Glucose: 134.11 mg/dL

## 2017-09-07 LAB — GLUCOSE, CAPILLARY: GLUCOSE-CAPILLARY: 131 mg/dL — AB (ref 65–99)

## 2017-09-07 MED ORDER — METFORMIN HCL 1000 MG PO TABS: 1000.0000 mg | ORAL_TABLET | Freq: Two times a day (BID) | ORAL | 0 refills | Status: AC

## 2017-09-07 MED ORDER — TRAZODONE HCL 100 MG PO TABS: 100.0000 mg | ORAL_TABLET | Freq: Every evening | ORAL | 0 refills | Status: AC | PRN

## 2017-09-07 MED ORDER — HYDROXYZINE HCL 25 MG PO TABS: 25.0000 mg | ORAL_TABLET | Freq: Four times a day (QID) | ORAL | 0 refills | Status: AC | PRN

## 2017-09-07 MED ORDER — CITALOPRAM HYDROBROMIDE 20 MG PO TABS: 20.0000 mg | ORAL_TABLET | Freq: Every day | ORAL | 0 refills | Status: AC

## 2017-09-07 NOTE — Discharge Summary (Signed)
Physician Discharge Summary Note  Patient:  Austin Ryan is an 46 y.o., male MRN:  [id number] DOB:  1971-06-16 Patient phone:  [phone] (home)  Patient address:   [address] [place] [address],  Total Time spent with patient: 20 minutes  Date of Admission:  09/02/2017 Date of Discharge: 09/07/17   Reason for Admission:  Substance abuse with SI  Principal Problem: MDD (major depressive disorder), recurrent episode, severe [facility]) Discharge Diagnoses: Patient Active Problem List   Diagnosis Date Noted  . MDD (major depressive disorder), recurrent episode, severe (HCC) [F33.2] 09/02/2017    Past Psychiatric History: Patient reports history of depression, reports history of suicidal attempt in 2011. Denies history of mania, denies history of psychosis, describes history of panic disorder but states that this has tended to improve overtime . States he has been prescribed  Celexa 40 mgrs QDAY for years   Past Medical History: History reviewed. No pertinent past medical history. History reviewed. No pertinent surgical history. Family History: History reviewed. No pertinent family history. Family Psychiatric  History: Father has history of alcohol dependence- now sober, and has attempted suicide in the past while intoxicated,patient's adult daughter has history of Bipolar Disorder   Social History:  History  Alcohol use Not on file     History  Drug use: Unknown    Social History   Social History  . Marital status: Divorced    Spouse name: N/A  . Number of children: N/A  . Years of education: N/A   Social History Main Topics  . Smoking status: Current Every Day Smoker    Packs/day: 1.50  . Smokeless tobacco: Never Used  . Alcohol use None  . Drug use: Unknown  . Sexual activity: Not Asked   Other Topics Concern  . None   Social History Narrative  . None    Hospital Course:   Admission Note: 46 yr old male presents voluntary for SI with plan to  overdose on pills; reports that he abuses oxycodone. Pt states he needs structure and came in to get structure for a few days before going home. Pt states that he wanted to go to "[facility] in [place] but they were full." Pt reports that he's dealt with self harm for a while and has been "struggling with benzos." Pt also stated that he would like something to "help me relax; I went 9 days before I took something and I just stayed in bed and didn't eat anything". Pt stated that he promised his son that he wouldn't overdose again and when he starts feeling this way he just comes to the hospital. Pt wants to be around for his grandchildren, he already has 1 with 2 on the way. Pt reports that he is currently homeless because he was kicked out of where he was staying for "pill taking" and says that he has a room at his daughter's house but feels like "they don't want me there to disrupt their routine." Pt's support system are his parents, and daughter.  40 year old divorced male, currently unemployed, lives with parents. Reports history of opiate dependence. States he uses Oxycodone daily, in variable quantities depending on availability. Use has progressed recently, and states that a few days ago for the first time ever used IV drug x 1 time. ( Normally uses PO)  States that he recently was asked by his parents to move out because of his drug abuse, which exacerbated his depression. Reports worsening depression, and  describes recent suicidal ideations with thoughts of overdosing on drugs .Denies psychotic symptoms, endorses some neuro-vegetative symptoms of depression as below. At this time reports symptoms of opiate WDL- cramps, aches, loose stools, anxiety.  Patient stabilized in 5 days on the unit. He went through detox for opiates and was started on Celexa. He denied any SI/HI/AVH. He reports that he needs to live with his parents some more, because they turned off their internet, he doesn't have his phone  anymore, and he needs to stay away from the crowd he was getting drugs from. He reports that he will go home and he will be contacting [facility] tomorrow and requesting a bed since there were none available today. He was provided with prescriptions for his medications and 7 days of samples. Patient made the comment at discharge, "Well you have to tell them you're suicidal just to get help. They turn you away for just asking for help for drug abuse."  Physical Findings: AIMS:  , ,  ,  ,    CIWA:  CIWA-Ar Total: 1 COWS:  COWS Total Score: 3  Musculoskeletal: Strength & Muscle Tone: within normal limits Gait & Station: normal Patient leans: N/A  Psychiatric Specialty Exam: Physical Exam  Nursing note and vitals reviewed. Constitutional: He is oriented to person, place, and time. He appears well-developed and well-nourished.  Cardiovascular: Normal rate.   Respiratory: Effort normal.  Musculoskeletal: Normal range of motion.  Neurological: He is alert and oriented to person, place, and time.  Skin: Skin is warm.    Review of Systems  Constitutional: Negative.   HENT: Negative.   Eyes: Negative.   Respiratory: Negative.   Cardiovascular: Negative.   Gastrointestinal: Negative.   Genitourinary: Negative.   Musculoskeletal: Negative.   Skin: Negative.   Neurological: Negative.   Endo/Heme/Allergies: Negative.     Blood pressure 122/66, pulse 80, temperature 98.5 F (36.9 C), temperature source Oral, resp. rate 16, height 5\' 4"  (1.626 m), weight 117.5 kg (259 lb), SpO2 100 %.Body mass index is 44.46 kg/m.  General Appearance: Casual  Eye Contact:  Good  Speech:  Clear and Coherent and Normal Rate  Volume:  Normal  Mood:  Euthymic  Affect:  Appropriate  Thought Process:  Coherent and Descriptions of Associations: Intact  Orientation:  Full (Time, Place, and Person)  Thought Content:  WDL  Suicidal Thoughts:  No  Homicidal Thoughts:  No  Memory:  Immediate;   Good Recent;    Good Remote;   Good  Judgement:  Good  Insight:  Good  Psychomotor Activity:  Normal  Concentration:  Concentration: Good and Attention Span: Good  Recall:  Good  Fund of Knowledge:  Good  Language:  Good  Akathisia:  No  Handed:  Right  AIMS (if indicated):     Assets:  Desire for Improvement Housing Social Support Transportation  ADL's:  Intact  Cognition:  WNL  Sleep:  Number of Hours: 6     Have you used any form of tobacco in the last 30 days? (Cigarettes, Smokeless Tobacco, Cigars, and/or Pipes): Yes  Has this patient used any form of tobacco in the last 30 days? (Cigarettes, Smokeless Tobacco, Cigars, and/or Pipes) Yes, Yes, A prescription for an FDA-approved tobacco cessation medication was offered at discharge and the patient refused  Blood Alcohol level:  Lab Results  Component Value Date   [facility]  04/08/2010    <5        LOWEST DETECTABLE LIMIT FOR SERUM ALCOHOL IS  5 mg/dL FOR MEDICAL PURPOSES ONLY    Metabolic Disorder Labs:   No results found for: PROLACTIN Lab Results  Component Value Date   CHOL 156 09/04/2017   TRIG 297 (H) 09/04/2017   HDL 32 (L) 09/04/2017   CHOLHDL 4.9 09/04/2017   VLDL 59 (H) 09/04/2017   LDLCALC 65 09/04/2017    See Psychiatric Specialty Exam and Suicide Risk Assessment completed by Attending Physician prior to discharge.  Discharge destination:  Home  Is patient on multiple antipsychotic therapies at discharge:  No   Has Patient had three or more failed trials of antipsychotic monotherapy by history:  No  Recommended Plan for Multiple Antipsychotic Therapies: NA   Allergies as of 09/07/2017      Reactions   Erythromycin Other (See Comments)   Reaction unknown - too long ago per pt   Bactrim [sulfamethoxazole-trimethoprim] Other (See Comments)   Red Man Syndrome, high fever   Sulfa Antibiotics Rash   Toradol [ketorolac Tromethamine] Other (See Comments)   "Makes me like the Walking Dead"      Medication List     STOP taking these medications   aspirin EC 81 MG tablet   clotrimazole-betamethasone cream Commonly known as:  LOTRISONE   traMADol 50 MG tablet Commonly known as:  ULTRAM     TAKE these medications     Indication  citalopram 20 MG tablet Commonly known as:  CELEXA Take 1 tablet (20 mg total) by mouth daily. For mood control  Indication:  mood stability   HUMULIN 70/30 KWIKPEN (70-30) 100 UNIT/ML PEN Generic drug:  Insulin Isophane & Regular Human Inject 15 Units into the skin 2 (two) times daily.  Indication:  Type 2 Diabetes   hydrOXYzine 25 MG tablet Commonly known as:  ATARAX/VISTARIL Take 1 tablet (25 mg total) by mouth every 6 (six) hours as needed for anxiety.  Indication:  Feeling Anxious   metFORMIN 1000 MG tablet Commonly known as:  GLUCOPHAGE Take 1 tablet (1,000 mg total) by mouth 2 (two) times daily with a meal. For diabetes What changed:  additional instructions  Indication:  Type 2 Diabetes   traZODone 100 MG tablet Commonly known as:  DESYREL Take 1 tablet (100 mg total) by mouth at bedtime as needed for sleep. What changed:  how much to take  when to take this  reasons to take this  Indication:  Trouble Sleeping      Follow-up [facility] [employer], [facility] Follow up on 09/10/2017.   Why:  Hospital follow-up on Thursday at 1:00PM. Thank you.  Contact information: [address] [place] [address] [phone]        [facility] Follow up.   Specialty:  Addiction Medicine Why:  Referral made on 09/04/17. You have been placed on waitlist. Please call Shayla in admissions daily at 9:00AM to check status of referral/waitlist. Thank you.  Contact information: [address] [place] [address] [phone]           Follow-up recommendations:  Continue activity as tolerated. Continue diet as recommended by your PCP. Ensure to keep all appointments with outpatient  providers.  Comments:  Patient is instructed prior to discharge to: Take all medications as prescribed by his/her mental healthcare provider. Report any adverse effects and or reactions from the medicines to his/her outpatient provider promptly. Patient has been instructed & cautioned: To not engage in alcohol and or illegal drug use while on prescription medicines. In  the event of worsening symptoms, patient is instructed to call the crisis hotline, 911 and or go to the nearest ED for appropriate evaluation and treatment of symptoms. To follow-up with his/her primary care provider for your other medical issues, concerns and or health care needs.    Signed: Gerlene Burdock Money, FNP 09/07/2017, 10:03 AM  Patient seen, Suicide Assessment Completed.  Disposition Plan Reviewed

## 2017-09-07 NOTE — Progress Notes (Signed)
Discharge note: Pt received both written and verbal discharge instructions. Pt  Verbalized understanding of discharge instructions. Pt agreed to f/u appt and med regimen. Pt received sample meds, prescriptions, AVS, SRA and transitional record. Pt gathered belongings from room and locker. Pt safely discharged to the lobby.

## 2017-09-07 NOTE — [facility] Suicide Risk Assessment (Signed)
[facility] Discharge Suicide Risk Assessment   Principal Problem: MDD (major depressive disorder), recurrent episode, severe (HCC) Discharge Diagnoses:  Patient Active Problem List   Diagnosis Date Noted  . MDD (major depressive disorder), recurrent episode, severe (HCC) [F33.2] 09/02/2017    Total Time spent with patient: 30 minutes  Musculoskeletal: Strength & Muscle Tone: within normal limits Gait & Station: normal Patient leans: N/A  Psychiatric Specialty Exam: ROS  Denies headache, no chest pain, no shortness of breath, no vomiting, has chronic pain.   Blood pressure 122/66, pulse 80, temperature 98.5 F (36.9 C), temperature source Oral, resp. rate 16, height  (1.626 m), weight 117.5 kg (259 lb), SpO2 100 %.Body mass index is 44.46 kg/m.  General Appearance: Fairly Groomed  Patent attorney::  Good  Speech:  Normal Rate409  Volume:  Normal  Mood:  improved mood, describes it as 8/10  Affect:  Appropriate and more reactive   Thought Process:  Linear and Descriptions of Associations: Intact  Orientation:  Full (Time, Place, and Person)  Thought Content:  no hallucinations, no delusions, not internally preoccupied   Suicidal Thoughts:  No denies suicidal or self injurious ideations, denies homicidal or violent ideations  Homicidal Thoughts:  No  Memory:  recent and remote grossly intact   Judgement:  Other:  improving   Insight:  improving   Psychomotor Activity:  Normal  Concentration:  Good  Recall:  Good  Fund of Knowledge:Good  Language: Good  Akathisia:  Negative  Handed:  Right  AIMS (if indicated):     Assets:  Communication Skills Desire for Improvement Resilience  Sleep:  Number of Hours: 6  Cognition: WNL  ADL's:  Intact   Mental Status Per Nursing Assessment::   On Admission:  Suicidal ideation indicated by patient, Self-harm thoughts, Self-harm behaviors  Demographic Factors:  46 year old male, divorced, has three adult children , currently unemployed     Loss Factors: Unemployment, had been living with his parents but states they asked him to leave due to relapse   Historical Factors: History of depression, history of suicide attempt in 2011 , history of opiate and BZD abuse   Risk Reduction Factors:   Positive coping skills or problem solving skills  Continued Clinical Symptoms:  Alert and attentive, fairly groomed, good eye contact, speech normal, mood improved, affect appropriate, more reactive, smiles at times appropriately, no thought disorder, no suicidal or self injurious ideations, no homicidal or violent ideations, no psychotic symptoms, future oriented. Behavior on unit calm and in good control. Denies medication side effects.  Cognitive Features That Contribute To Risk:  No gross cognitive deficits noted upon discharge. Is alert , attentive, and oriented x 3   Suicide Risk:  Mild:  Suicidal ideation of limited frequency, intensity, duration, and specificity.  There are no identifiable plans, no associated intent, mild dysphoria and related symptoms, good self-control (both objective and subjective assessment), few other risk factors, and identifiable protective factors, including available and accessible social support.  Follow-up Information    [facility] Follow up on 09/10/2017.   Why:  Hospital follow-up on Thursday at 1:00PM. Thank you.  Contact information: [address] [place] [address] [phone]        [facility] Follow up.   Specialty:  Addiction Medicine Why:  Referral made on 09/04/17. You have been placed on waitlist. Please call Shayla in admissions daily at 9:00AM to check status of referral/waitlist. Thank you.  Contact information:  [address] [place] [address] [phone]           Plan Of Care/Follow-up recommendations:  Activity:  as tolerated  Diet:  Heart Helathy, Diabetic Diet  Tests:  NA Other:  As below  Patient is  expressing feeling significantly better and being ready for discharge Follow up as above  [facility] of medical issues .  Craige CottaFernando A Miata Culbreth, MD 09/07/2017, 10:43 AM

## 2017-09-07 NOTE — Progress Notes (Signed)
  [facility] :  Will you be returning to the same living situation after discharge:  Yes,  home with parents At discharge, do you have transportation home?: Yes,  father Do you have the ability to pay for your medications: Yes,  mental health  Release of information consent forms completed and submitted to medical records by CSW.  Patient to Follow up at: Follow-up Information    [facility] Follow up on 09/10/2017.   Why:  Hospital follow-up on Thursday at 1:00PM. Thank you.  Contact information: [address] [place] [address] [phone]        [facility] Follow up.   Specialty:  Addiction Medicine Why:  Referral made on 09/04/17. You have been placed on waitlist. Please call Shayla in admissions daily at 9:00AM to check status of referral/waitlist. Thank you.  Contact information: [address] [place] [address] [phone]           Next level of care provider has access to [facility] Link:no  Safety Planning and Suicide Prevention discussed: Yes,  SPE completed with pt's mother. [facility] Pamphlet and Mobile Crisis information provided to pt.   Have you used any form of tobacco in the last 30 days? (Cigarettes, Smokeless Tobacco, Cigars, and/or Pipes): Yes  Has patient been referred to the Quitline?: Patient refused referral  Patient has been referred for addiction treatment: Yes  Pulte HomesHeather N Smart, LCSW 09/07/2017, 10:03 AM

## 2017-09-07 NOTE — Progress Notes (Signed)
Recreation Therapy Notes  Date: 09/07/17 Time: 0930 Location: [address] Dayroom  Group Topic: Stress Management  Goal Area(s) Addresses:  Patient will verbalize importance of using healthy stress management.  Patient will identify positive emotions associated with healthy stress management.   Behavioral Response: Engaged  Intervention: Stress Management  Activity :  Meditation.   LRT introduced the stress management technique of meditation.  LRT played Ryan meditation from the Calm app that focused on scanning the body.  Patients were to focus on and make not of whatever sensations they were feeling.  Education:  Stress Management, Discharge Planning.   Education Outcome: Acknowledges edcuation/In group clarification offered/Needs additional education  Clinical Observations/Feedback: Pt attended group.   Austin Ryan, LRT/CTRS         Austin Ryan, Austin Ryan 09/07/2017 12:01 PM

## 2017-09-07 NOTE — Progress Notes (Signed)
Patient has been up briefly tonight. He did not care to watch the football game so he went to his room. He attended group this evening and has voiced no complaints. He reported that he is taking his medications early tonight so he can get a good nights rest. He is hopeful to discharge on tomorrow. Patient currently denies si/hi/a/v hall. Support and encouragement offered, safety maintained on unit, will continue to monitor.

## 2018-07-29 DEATH — deceased
